# Patient Record
Sex: Female | Born: 1960
Health system: Southern US, Community
[De-identification: ages and names within clinical notes are randomized; demographics above are authoritative.]

## PROBLEM LIST (undated history)

## (undated) DIAGNOSIS — Z87440 Personal history of urinary (tract) infections: Secondary | ICD-10-CM

## (undated) HISTORY — PX: ENDOMETRIAL ABLATION: SHX621

## (undated) HISTORY — PX: APPENDECTOMY: SHX54

## (undated) HISTORY — PX: HAND SURGERY: SHX662

## (undated) HISTORY — PX: BLADDER SURGERY: SHX569

---

## 1998-05-13 ENCOUNTER — Ambulatory Visit (HOSPITAL_COMMUNITY): Admission: RE | Admit: 1998-05-13 | Discharge: 1998-05-13 | Payer: Self-pay | Admitting: Gynecology

## 1998-05-13 ENCOUNTER — Encounter: Payer: Self-pay | Admitting: Gynecology

## 1998-07-13 ENCOUNTER — Inpatient Hospital Stay (HOSPITAL_COMMUNITY): Admission: AD | Admit: 1998-07-13 | Discharge: 1998-07-13 | Payer: Self-pay | Admitting: Gynecology

## 1998-07-13 ENCOUNTER — Ambulatory Visit (HOSPITAL_COMMUNITY): Admission: RE | Admit: 1998-07-13 | Discharge: 1998-07-13 | Payer: Self-pay | Admitting: Gynecology

## 1999-08-27 ENCOUNTER — Other Ambulatory Visit: Admission: RE | Admit: 1999-08-27 | Discharge: 1999-08-27 | Payer: Self-pay | Admitting: Gynecology

## 1999-10-13 ENCOUNTER — Other Ambulatory Visit: Admission: RE | Admit: 1999-10-13 | Discharge: 1999-10-13 | Payer: Self-pay | Admitting: Gynecology

## 1999-10-16 ENCOUNTER — Ambulatory Visit (HOSPITAL_COMMUNITY): Admission: RE | Admit: 1999-10-16 | Discharge: 1999-10-16 | Payer: Self-pay | Admitting: Gynecology

## 2000-02-13 ENCOUNTER — Inpatient Hospital Stay (HOSPITAL_COMMUNITY): Admission: AD | Admit: 2000-02-13 | Discharge: 2000-02-13 | Payer: Self-pay | Admitting: Gynecology

## 2000-03-01 ENCOUNTER — Encounter: Admission: RE | Admit: 2000-03-01 | Discharge: 2000-03-01 | Payer: Self-pay | Admitting: Internal Medicine

## 2000-03-01 ENCOUNTER — Encounter: Payer: Self-pay | Admitting: Internal Medicine

## 2000-03-13 ENCOUNTER — Inpatient Hospital Stay (HOSPITAL_COMMUNITY): Admission: AD | Admit: 2000-03-13 | Discharge: 2000-03-13 | Payer: Self-pay | Admitting: Gynecology

## 2000-10-26 ENCOUNTER — Ambulatory Visit (HOSPITAL_COMMUNITY): Admission: RE | Admit: 2000-10-26 | Discharge: 2000-10-26 | Payer: Self-pay | Admitting: Gynecology

## 2000-10-26 ENCOUNTER — Encounter: Payer: Self-pay | Admitting: Gynecology

## 2001-08-23 ENCOUNTER — Other Ambulatory Visit: Admission: RE | Admit: 2001-08-23 | Discharge: 2001-08-23 | Payer: Self-pay | Admitting: Obstetrics and Gynecology

## 2002-03-03 ENCOUNTER — Inpatient Hospital Stay (HOSPITAL_COMMUNITY): Admission: AD | Admit: 2002-03-03 | Discharge: 2002-03-05 | Payer: Self-pay | Admitting: Obstetrics and Gynecology

## 2002-03-06 ENCOUNTER — Encounter: Admission: RE | Admit: 2002-03-06 | Discharge: 2002-04-05 | Payer: Self-pay | Admitting: Obstetrics and Gynecology

## 2002-04-03 ENCOUNTER — Other Ambulatory Visit: Admission: RE | Admit: 2002-04-03 | Discharge: 2002-04-03 | Payer: Self-pay | Admitting: Obstetrics and Gynecology

## 2002-05-06 ENCOUNTER — Encounter: Admission: RE | Admit: 2002-05-06 | Discharge: 2002-06-05 | Payer: Self-pay | Admitting: Obstetrics and Gynecology

## 2002-07-06 ENCOUNTER — Encounter: Admission: RE | Admit: 2002-07-06 | Discharge: 2002-08-05 | Payer: Self-pay | Admitting: Obstetrics and Gynecology

## 2002-08-06 ENCOUNTER — Encounter: Admission: RE | Admit: 2002-08-06 | Discharge: 2002-09-05 | Payer: Self-pay | Admitting: Obstetrics and Gynecology

## 2002-10-05 ENCOUNTER — Encounter: Admission: RE | Admit: 2002-10-05 | Discharge: 2002-11-04 | Payer: Self-pay | Admitting: Obstetrics and Gynecology

## 2002-12-05 ENCOUNTER — Encounter: Admission: RE | Admit: 2002-12-05 | Discharge: 2003-01-04 | Payer: Self-pay | Admitting: Obstetrics and Gynecology

## 2003-02-04 ENCOUNTER — Encounter: Admission: RE | Admit: 2003-02-04 | Discharge: 2003-03-06 | Payer: Self-pay | Admitting: Obstetrics and Gynecology

## 2003-03-07 ENCOUNTER — Encounter: Admission: RE | Admit: 2003-03-07 | Discharge: 2003-04-06 | Payer: Self-pay | Admitting: Obstetrics and Gynecology

## 2004-08-05 ENCOUNTER — Ambulatory Visit: Payer: Self-pay | Admitting: Pulmonary Disease

## 2005-01-14 ENCOUNTER — Encounter: Admission: RE | Admit: 2005-01-14 | Discharge: 2005-01-14 | Payer: Self-pay | Admitting: Occupational Medicine

## 2005-03-29 ENCOUNTER — Other Ambulatory Visit: Admission: RE | Admit: 2005-03-29 | Discharge: 2005-03-29 | Payer: Self-pay | Admitting: Obstetrics and Gynecology

## 2005-04-20 ENCOUNTER — Encounter: Admission: RE | Admit: 2005-04-20 | Discharge: 2005-04-20 | Payer: Self-pay | Admitting: Obstetrics and Gynecology

## 2005-11-01 ENCOUNTER — Encounter: Admission: RE | Admit: 2005-11-01 | Discharge: 2006-01-30 | Payer: Self-pay | Admitting: Podiatry

## 2006-05-31 ENCOUNTER — Encounter: Admission: RE | Admit: 2006-05-31 | Discharge: 2006-05-31 | Payer: Self-pay | Admitting: Obstetrics and Gynecology

## 2006-09-07 ENCOUNTER — Ambulatory Visit (HOSPITAL_COMMUNITY): Admission: RE | Admit: 2006-09-07 | Discharge: 2006-09-07 | Payer: Self-pay | Admitting: Obstetrics and Gynecology

## 2006-09-20 ENCOUNTER — Inpatient Hospital Stay (HOSPITAL_COMMUNITY): Admission: AD | Admit: 2006-09-20 | Discharge: 2006-09-21 | Payer: Self-pay | Admitting: Obstetrics & Gynecology

## 2007-06-15 ENCOUNTER — Ambulatory Visit (HOSPITAL_COMMUNITY): Admission: RE | Admit: 2007-06-15 | Discharge: 2007-06-15 | Payer: Self-pay | Admitting: Obstetrics and Gynecology

## 2008-06-19 ENCOUNTER — Ambulatory Visit (HOSPITAL_COMMUNITY): Admission: RE | Admit: 2008-06-19 | Discharge: 2008-06-19 | Payer: Self-pay | Admitting: Obstetrics and Gynecology

## 2009-07-03 ENCOUNTER — Encounter: Admission: RE | Admit: 2009-07-03 | Discharge: 2009-07-03 | Payer: Self-pay | Admitting: Obstetrics and Gynecology

## 2010-03-26 ENCOUNTER — Emergency Department (HOSPITAL_COMMUNITY): Admission: EM | Admit: 2010-03-26 | Discharge: 2010-03-26 | Payer: Self-pay | Admitting: Emergency Medicine

## 2010-04-02 ENCOUNTER — Emergency Department (HOSPITAL_COMMUNITY): Admission: EM | Admit: 2010-04-02 | Discharge: 2010-04-02 | Payer: Self-pay | Admitting: Family Medicine

## 2010-07-07 ENCOUNTER — Ambulatory Visit (HOSPITAL_COMMUNITY): Admission: RE | Admit: 2010-07-07 | Payer: Self-pay | Source: Home / Self Care | Admitting: Obstetrics and Gynecology

## 2010-07-10 ENCOUNTER — Encounter
Admission: RE | Admit: 2010-07-10 | Discharge: 2010-07-10 | Payer: Self-pay | Source: Home / Self Care | Attending: Obstetrics and Gynecology | Admitting: Obstetrics and Gynecology

## 2010-07-19 ENCOUNTER — Encounter: Payer: Self-pay | Admitting: Obstetrics and Gynecology

## 2010-07-29 ENCOUNTER — Encounter: Payer: Self-pay | Admitting: Obstetrics and Gynecology

## 2010-09-10 LAB — WOUND CULTURE

## 2010-11-13 NOTE — Op Note (Signed)
[facility]  Patient:    Shelley Pearson, Shelley Pearson                      MRN: [id number] Proc. Date: 10/16/99 Adm. Date:  [id number] Disc. Date: [id number] Attending:  Katrina Stack                           Operative Report  REDICTATION  PREOPERATIVE DIAGNOSES:  Endometriosis with incapacitating cyclic pelvic pain, recurrent, post previous diagnostic laparoscopy, fimbriolysis and fimbrioplasty for a right hydrosalpinx, lysis of extensive pelvic adhesions, laser ablation of stage III endometriosis.  POSTOPERATIVE DIAGNOSES:  Endometriosis with incapacitating cyclic pelvic pain, recurrent, post previous diagnostic laparoscopy, fimbriolysis and fimbrioplasty for a right hydrosalpinx, lysis of extensive pelvic adhesions, laser ablation of stage III endometriosis.  PROCEDURE:  Diagnostic laparoscopy, laser vaporization of extensive stage II endometriosis, lysis of minor adhesions.  SURGEON:  Gretta Cool, M.D.  ANESTHESIA:  General endotracheal.  DESCRIPTION OF PROCEDURE:  Under excellent anesthesia, as above, with the patients abdomen prepped and draped as a sterile field, with Hulka tenaculum applied to her cervix and bladder drained, a subumbilical incision was made and Veress cannula introduced.  After adequate pneumoperitoneum with CO2, laparoscopic trocar was introduced and pelvic organs visualized.  Accessory probes were then placed and for manipulation of the adnexal structures. Examination revealed again extensive endometriosis, now rated at stage III with involvement of the cul-de-sac, the posterior aspect of the uterus, both posterior broad ligaments.  She had no evidence of recurrence of the hydrosalpinx previously noted.  The fallopian tube was of normal size.  The fimbriated end appeared somewhat agglutinated but there was definitely not recurrence of the hydrosalpinx.  A paraovarian cyst was present and was probably the cause of the  ultrasound findings suggesting recurrent hydrosalpinx.  She had no evidence of ovarian endometrioma and only minor surface disease on the ovaries.  At this point, the paraovarian cyst was incised with laser and drained.  The laser was then used to vaporized all of the endometriosis possible.  A brush technique was used to treat the surface disease of the posterior aspect of the broad ligament, so as to sterilize the surface of peritoneum of endometriosis next to large areas of endometriosis. There was no evidence of deep burrowing disease and deeply invasive endometriosis; it all appeared to be quite surface disease.  Careful attempt was made to remove all of the carbon and debris by forceful irrigation of the peritoneal surfaces.  Fallopian tube dye injection was undertaken and ready spill was identified from the left fallopian tube and possible spill from the right was noted.  The dye was then irrigated from the peritoneal cavity and the pelvis filled with a liter of lactated Ringers to float the structures out of the pelvis and prevent re-adhesion.  At this point, the procedure was terminated without complication, gas allowed to escape, incisions closed with deep suture of 5-0 Dexon and the skin closure with Steri-Strips.  At the end of the procedure, sponge and lap counts were correct.  No complications. Patient returned to recovery room in excellent condition. DD:  11/21/99 TD:  11/25/99 Job: [id number]

## 2010-11-13 NOTE — H&P (Signed)
Shelley Pearson, Shelley Pearson NO.:  [id number]   MEDICAL RECORD NO.:  [id number]          PATIENT TYPE:  AMB   LOCATION:  SDC                           FACILITY:  [facility]   PHYSICIAN:  Juluis Mire, M.D.   DATE OF BIRTH:  Aug 23, 1960   DATE OF ADMISSION:  09/07/2006  DATE OF DISCHARGE:                              HISTORY & PHYSICAL   HISTORY OF PRESENT ILLNESS:  The patient is a 50 year old gravida 2,  para 2 female who presents for management of stress incontinence.   The patient has had worsening stress incontinence, leaking urine with  events such as exercise, sneezing or coughing.  She underwent urodynamic  studies in the office.  She did have a borderline bladder capacity.  She  had minimal residual.  The studies were consistent with urethral  hypermobility.  She had normal leak point pressures as well as a normal  urethral pressure profile.  There was no evidence of overactive bladder  activity.  Her voiding pattern revealed no obstructive voiding pattern.  She did have a mild cystourethrocele.  In view of this, she decided to  proceed now with a mid-urethral sling.  We are going to use an obturator  approach.   ALLERGIES:  NO KNOWN DRUG ALLERGIES.   MEDICATIONS:  1. Albuterol inhaler.  2. Citalopram 20 mg a day.  3. Advair b.i.d.   PAST MEDICAL HISTORY:  She does have a history of asthmatic bronchitis,  otherwise usual childhood disease without any significant sequelae.   PAST SURGICAL HISTORY:  1. She had laparoscopies in 1974 and 1977.  2. She had appendectomy at age 61.  3. She had 2 scar revisions.  4. She had previous hand surgery for carpal tunnel.   OBSTETRICAL HISTORY:  She has also had 2 vaginal deliveries.   FAMILY HISTORY:  Mother with a history of chronic hypertension, father  with a history of prostate cancer.   SOCIAL HISTORY:  No tobacco or alcohol use.   REVIEW OF SYSTEMS:  Noncontributory.   PHYSICAL EXAM:  VITAL SIGNS:  The patient  is afebrile with stable vital  signs.  HEENT:  The patient is normocephalic.  Pupils are equal, round and  reactive to light and accommodation.  Extraocular movements were intact.  Sclerae and conjunctivae were clear.  Oropharynx clear.  NECK:  Without thyromegaly.  BREASTS:  No discrete masses.  LUNGS:  Clear.  CARDIOVASCULAR:  Regular rhythm and rate without murmurs or gallops.  ABDOMEN:  Exam is benign.  No masses, organomegaly or tenderness.  PELVIC:  Normal external genitalia.  Vaginal mucosa is clear.  She does  have mild cystourethrocele.  Cervix unremarkable.  Uterus normal size,  shape and contour.  Adnexa free of masses or tenderness.  EXTREMITIES:  Trace edema.  NEUROLOGICAL:  Exam is grossly within normal limits.   IMPRESSION:  Anatomical stress urinary incontinence due to urethral  hypermobility.   PLAN:  The patient will undergo a mid-urethral sling.  The success rates  of 80% to 85% are quoted.  We have discussed the potential risk of over-  tightening  leading to inability to void, requiring long-term  catheterization; this could require [place] to take the sling down.  If we  took the sling down, there could be a return of her incontinence.  There  is a risk of mesh erosion into vagina, the bladder or urethra that could  require removal of mesh and return of incontinence.  There is the risk  of obturator nerve injury during placement; this could lead to chronic  leg pain and weakness.  There is also the risk of injury to organs  including bladder, urethra and ureters that could require further  exploratory surgery.  There is a risk of infection, the risk of vascular  injury that could lead to hemorrhage requiring transfusion with the risk  of AIDS or hepatitis, the risk of deep venous thrombosis and pulmonary  embolus.  The patient expressed understanding of indications, risks and  alternatives.      Juluis Mire, M.D.  Electronically Signed     JSM/MEDQ  D:   09/07/2006  T:  09/08/2006  Job:  [id number]

## 2010-11-13 NOTE — Op Note (Signed)
NAMEKLOI, BRODMAN NO.:  [id number]   MEDICAL RECORD NO.:  [id number]          PATIENT TYPE:  AMB   LOCATION:  SDC                           FACILITY:  [facility]   PHYSICIAN:  Juluis Mire, M.D.   DATE OF BIRTH:  08-Dec-1960   DATE OF PROCEDURE:  09/07/2006  DATE OF DISCHARGE:                               OPERATIVE REPORT   PREOPERATIVE DIAGNOSIS:  Stress urinary incontinence.   POSTOPERATIVE DIAGNOSIS:  Stress urinary incontinence.   OPERATIVE PROCEDURE:  Mid urethral sling using the obturator approach.  Cystoscopy.   SURGEON:  Juluis Mire, M.D.   ANESTHESIA:  General.   ESTIMATED BLOOD LOSS:  Minimal.   PACKS AND DRAINS:  None.   INTRAOPERATIVE BLOOD REPLACED:  None.   COMPLICATIONS:  None.   INDICATIONS:  Dictated in history and physical.   PROCEDURE AS FOLLOWS:  The patient was taken to the OR and placed in  supine position.  After satisfactory level of general endotracheal  anesthesia was obtained, the patient was placed in the dorsal spine  position using the Allen stirrups.  The perineum and vagina were  cleansed out with Betadine and draped in sterile field.  At this point  in time, a Foley was placed to straight drain.  The mid urethral area  was identified.  The area was infiltrated with 1% Xylocaine with  1:100,000 of epinephrine.  An incision was made in the mid urethral  area.  We then dissected out laterally to the obturator foramen on each  side.  Next, on the perineum, we identified 2 areas at the level of the  clitoris below the abductus longus tendon and out the lateral edge of  the inferior pubic ramus.  A cell area was infiltrated with Xylocaine  and epinephrine.  A punch incision was made.  Next, using the [facility]  Scientific halo, the needles were placed through the obturator foramen,  encircled around the inferior pubic ramus and brought out through the  vaginal incision on each side.  Next, cystoscopy was performed.  There  was no evidence of any intrusion of the bladder or the urethra.  Both  ureteral orifices were visualized and noted to have puff of clear urine.  At this point in time, the polypropylene mesh sling was brought into  place and brought out through the incisions and properly fixed under the  urethra.  It was in the mid urethral segment.  It was lying flat, but we  could easily get a Tresa Endo and rotate it 90 degrees.  Felt like it was not  over tightened.  At this point in time, the plastic sheaths were  removed, and the ends of the polypropylene mesh were trimmed flush with  the skin.  They retracted nicely.  Vaginal mucosa was then closed with a  running suture of 2-0 Monocryl.  The skin incisions were closed with  Dermabond.  A  Foley was placed to straight drain.  Clear urine was still observed.  At  this point in time, the patient was taken out of the dorsal lithotomy  position. Once  alert and extubated, transferred to recovery room in good  condition.  Sponge, instrument and needle counts were reported as  correct by circulating nurse x2.      Juluis Mire, M.D.  Electronically Signed     JSM/MEDQ  D:  09/07/2006  T:  09/08/2006  Job:  [id number]

## 2011-06-01 ENCOUNTER — Encounter: Payer: Self-pay | Admitting: Internal Medicine

## 2011-06-27 ENCOUNTER — Emergency Department (INDEPENDENT_AMBULATORY_CARE_PROVIDER_SITE_OTHER): Payer: 59

## 2011-06-27 ENCOUNTER — Inpatient Hospital Stay (HOSPITAL_BASED_OUTPATIENT_CLINIC_OR_DEPARTMENT_OTHER)
Admission: EM | Admit: 2011-06-27 | Discharge: 2011-06-28 | DRG: 200 | Disposition: A | Discharge status: Home / Self Care | Payer: 59 | Attending: General Surgery | Admitting: General Surgery

## 2011-06-27 ENCOUNTER — Emergency Department (HOSPITAL_BASED_OUTPATIENT_CLINIC_OR_DEPARTMENT_OTHER): Payer: 59

## 2011-06-27 DIAGNOSIS — F1010 Alcohol abuse, uncomplicated: Secondary | ICD-10-CM | POA: Diagnosis present

## 2011-06-27 DIAGNOSIS — M79609 Pain in unspecified limb: Secondary | ICD-10-CM

## 2011-06-27 DIAGNOSIS — R079 Chest pain, unspecified: Secondary | ICD-10-CM

## 2011-06-27 DIAGNOSIS — Z87440 Personal history of urinary (tract) infections: Secondary | ICD-10-CM

## 2011-06-27 DIAGNOSIS — S2241XA Multiple fractures of ribs, right side, initial encounter for closed fracture: Secondary | ICD-10-CM | POA: Diagnosis present

## 2011-06-27 DIAGNOSIS — M549 Dorsalgia, unspecified: Secondary | ICD-10-CM

## 2011-06-27 DIAGNOSIS — S270XXA Traumatic pneumothorax, initial encounter: Secondary | ICD-10-CM

## 2011-06-27 DIAGNOSIS — J939 Pneumothorax, unspecified: Secondary | ICD-10-CM | POA: Diagnosis present

## 2011-06-27 DIAGNOSIS — S2239XA Fracture of one rib, unspecified side, initial encounter for closed fracture: Secondary | ICD-10-CM

## 2011-06-27 DIAGNOSIS — S2249XA Multiple fractures of ribs, unspecified side, initial encounter for closed fracture: Secondary | ICD-10-CM

## 2011-06-27 DIAGNOSIS — W19XXXA Unspecified fall, initial encounter: Secondary | ICD-10-CM | POA: Diagnosis present

## 2011-06-27 DIAGNOSIS — J9383 Other pneumothorax: Secondary | ICD-10-CM

## 2011-06-27 DIAGNOSIS — R109 Unspecified abdominal pain: Secondary | ICD-10-CM

## 2011-06-27 DIAGNOSIS — Z888 Allergy status to other drugs, medicaments and biological substances status: Secondary | ICD-10-CM

## 2011-06-27 DIAGNOSIS — M25559 Pain in unspecified hip: Secondary | ICD-10-CM

## 2011-06-27 DIAGNOSIS — Z79899 Other long term (current) drug therapy: Secondary | ICD-10-CM

## 2011-06-27 DIAGNOSIS — J45909 Unspecified asthma, uncomplicated: Secondary | ICD-10-CM | POA: Diagnosis present

## 2011-06-27 DIAGNOSIS — N949 Unspecified condition associated with female genital organs and menstrual cycle: Secondary | ICD-10-CM

## 2011-06-27 HISTORY — DX: Personal history of urinary (tract) infections: Z87.440

## 2011-06-27 LAB — CBC
Hemoglobin: 12.1 g/dL (ref 12.0–15.0)
Platelets: 197 10*3/uL (ref 150–400)
RBC: 4.09 MIL/uL (ref 3.87–5.11)
RDW: 14.2 % (ref 11.5–15.5)
WBC: 7.8 10*3/uL (ref 4.0–10.5)

## 2011-06-27 LAB — DIFFERENTIAL
Basophils Absolute: 0 10*3/uL (ref 0.0–0.1)
Basophils Relative: 0 % (ref 0–1)
Eosinophils Absolute: 0.1 10*3/uL (ref 0.0–0.7)
Lymphocytes Relative: 14 % (ref 12–46)
Neutro Abs: 6.2 10*3/uL (ref 1.7–7.7)
Neutrophils Relative %: 79 % — ABNORMAL HIGH (ref 43–77)

## 2011-06-27 LAB — COMPREHENSIVE METABOLIC PANEL
ALT: 26 U/L (ref 0–35)
AST: 38 U/L — ABNORMAL HIGH (ref 0–37)
CO2: 29 mEq/L (ref 19–32)
Calcium: 9.3 mg/dL (ref 8.4–10.5)
Chloride: 102 mEq/L (ref 96–112)
GFR calc Af Amer: 84 mL/min — ABNORMAL LOW (ref >90)
GFR calc non Af Amer: 73 mL/min — ABNORMAL LOW (ref >90)
Glucose, Bld: 108 mg/dL — ABNORMAL HIGH (ref 70–99)
Total Protein: 6.9 g/dL (ref 6.0–8.3)

## 2011-06-27 MED ORDER — HYDROMORPHONE HCL PF 1 MG/ML IJ SOLN
1.0000 mg | Freq: Once | INTRAMUSCULAR | Status: AC
  Administered 2011-06-27: 1 mg via INTRAVENOUS
  Filled 2011-06-27: qty 1

## 2011-06-27 MED ORDER — HYDROCODONE-ACETAMINOPHEN 5-325 MG PO TABS
2.0000 | ORAL_TABLET | Freq: Once | ORAL | Status: AC
  Administered 2011-06-27: 2 via ORAL
  Filled 2011-06-27: qty 2

## 2011-06-27 MED ORDER — SODIUM CHLORIDE 0.9 % IV SOLN
Freq: Once | INTRAVENOUS | Status: AC
  Administered 2011-06-27: 1000 mL via INTRAVENOUS

## 2011-06-27 MED ORDER — IOHEXOL 300 MG/ML  SOLN
100.0000 mL | Freq: Once | INTRAMUSCULAR | Status: AC | PRN
  Administered 2011-06-27: 100 mL via INTRAVENOUS

## 2011-06-27 MED ORDER — ONDANSETRON HCL 4 MG/2ML IJ SOLN
4.0000 mg | Freq: Once | INTRAMUSCULAR | Status: AC
  Administered 2011-06-27: 4 mg via INTRAVENOUS
  Filled 2011-06-27: qty 2

## 2011-06-27 NOTE — ED Provider Notes (Signed)
History     CSN: [id number]  Arrival date & time 06/27/11  1478   First MD Initiated Contact with Patient 06/27/11 2043      Chief Complaint  Patient presents with  . Fall    (Consider location/radiation/quality/duration/timing/severity/associated sxs/prior treatment) Patient is a 50 y.o. female presenting with fall. The history is provided by the patient. No language interpreter was used.  Fall The accident occurred less than 1 hour ago. The fall occurred while recreating/playing. She fell from a height of 3 to 5 ft. She landed on dirt. There was no blood loss. The point of impact was the right knee. The pain is at a severity of 8/10. The pain is severe. She was ambulatory at the scene. There was no entrapment after the fall. There was no drug use involved in the accident. There was no alcohol use involved in the accident. The symptoms are aggravated by activity. She has tried nothing for the symptoms. The treatment provided moderate relief.  Pt reports she was riding a blind horse and horse spooked in the woods.  Pt complains of pain in right ribs,  Pain in low back.  Pt reports she had on a helmet.  Pt did not hit her head.  No loss of conciousness  Past Medical History  Diagnosis Date  . Asthma     Past Surgical History  Procedure Date  . Hand surgery   . Bladder surgery   . Endometrial ablation   . Appendectomy     No family history on file.  History  Substance Use Topics  . Smoking status: Never Smoker   . Smokeless tobacco: Not on file  . Alcohol Use: Yes    OB History    Grav Para Term Preterm Abortions TAB SAB Ect Mult Living                  Review of Systems  All other systems reviewed and are negative.    Allergies  Suprax  Home Medications   Current Outpatient Rx  Name Route Sig Dispense Refill  . ALBUTEROL SULFATE HFA 108 (90 BASE) MCG/ACT IN AERS Inhalation Inhale 2 puffs into the lungs every 6 (six) hours as needed. For shortness of breath  or wheezing      . ESCITALOPRAM OXALATE 20 MG PO TABS Oral Take 20 mg by mouth daily.      Marland Kitchen NITROFURANTOIN MONOHYD MACRO 100 MG PO CAPS Oral Take 100 mg by mouth daily.      Marland Kitchen PHENTERMINE HCL 37.5 MG PO CAPS Oral Take 37.5 mg by mouth every morning.        BP 113/57  Pulse 96  Temp(Src) 97.7 F (36.5 C) (Oral)  Resp 18  Ht 5\' 5"  (1.651 m)  Wt 131 lb (59.421 kg)  BMI 21.80 kg/m2  SpO2 100%  LMP 04/22/2011  Physical Exam  Nursing note and vitals reviewed. Constitutional: She is oriented to person, place, and time. She appears well-developed and well-nourished.  HENT:  Head: Normocephalic and atraumatic.  Right Ear: External ear normal.  Left Ear: External ear normal.  Nose: Nose normal.  Mouth/Throat: Oropharynx is clear and moist.  Eyes: Conjunctivae and EOM are normal. Pupils are equal, round, and reactive to light.  Neck: Normal range of motion. Neck supple.  Cardiovascular: Normal rate and normal heart sounds.   Pulmonary/Chest: Effort normal and breath sounds normal.  Abdominal: Soft.  Musculoskeletal: She exhibits tenderness.       Tender right anterior  ribs,  Bruised right low back,  Bruised area right lower leg.    Neurological: She is alert and oriented to person, place, and time.  Skin: Skin is warm.  Psychiatric: She has a normal mood and affect.    ED Course  Procedures (including critical care time)  Labs Reviewed - No data to display No results found.   No diagnosis found.    MDM  Pt has 10 percent pneumothorax.  Ct scan of chest shows rib fracture at right 4th and 5th ribs.    Dr. Rosalia Hammers in to see.  I spoke to Dr. Andrey Campanile trauma surgeon.  He advised transfer to [place].  Trauma will see there.        Langston Masker, [place] 06/27/11 2336  Langston Masker, [place] 06/27/11 [phone]

## 2011-06-27 NOTE — ED Notes (Signed)
Thrown from horse approx 5pm-pain to right rib area, right leg, lower hip/flank pain

## 2011-06-28 ENCOUNTER — Encounter (HOSPITAL_COMMUNITY): Payer: Self-pay | Admitting: General Surgery

## 2011-06-28 ENCOUNTER — Inpatient Hospital Stay (HOSPITAL_COMMUNITY): Payer: 59

## 2011-06-28 DIAGNOSIS — Z789 Other specified health status: Secondary | ICD-10-CM | POA: Insufficient documentation

## 2011-06-28 DIAGNOSIS — J939 Pneumothorax, unspecified: Secondary | ICD-10-CM | POA: Diagnosis present

## 2011-06-28 DIAGNOSIS — W19XXXA Unspecified fall, initial encounter: Secondary | ICD-10-CM | POA: Diagnosis present

## 2011-06-28 DIAGNOSIS — Z7289 Other problems related to lifestyle: Secondary | ICD-10-CM | POA: Insufficient documentation

## 2011-06-28 DIAGNOSIS — N390 Urinary tract infection, site not specified: Secondary | ICD-10-CM | POA: Insufficient documentation

## 2011-06-28 DIAGNOSIS — J45909 Unspecified asthma, uncomplicated: Secondary | ICD-10-CM | POA: Insufficient documentation

## 2011-06-28 DIAGNOSIS — S2241XA Multiple fractures of ribs, right side, initial encounter for closed fracture: Secondary | ICD-10-CM | POA: Diagnosis present

## 2011-06-28 LAB — CBC
HCT: 37.6 % (ref 36.0–46.0)
MCH: 29.5 pg (ref 26.0–34.0)
MCV: 92.4 fL (ref 78.0–100.0)
Platelets: 179 10*3/uL (ref 150–400)
RBC: 4.07 MIL/uL (ref 3.87–5.11)
RDW: 14.6 % (ref 11.5–15.5)

## 2011-06-28 LAB — BASIC METABOLIC PANEL
BUN: 12 mg/dL (ref 6–23)
CO2: 29 mEq/L (ref 19–32)
Calcium: 8.9 mg/dL (ref 8.4–10.5)
Creatinine, Ser: 0.66 mg/dL (ref 0.50–1.10)

## 2011-06-28 MED ORDER — HYDROMORPHONE HCL PF 1 MG/ML IJ SOLN
1.0000 mg | Freq: Once | INTRAMUSCULAR | Status: AC
  Administered 2011-06-28: 1 mg via INTRAVENOUS
  Filled 2011-06-28: qty 1

## 2011-06-28 MED ORDER — SODIUM CHLORIDE 0.9 % IV SOLN
INTRAVENOUS | Status: DC
  Administered 2011-06-28: 05:00:00 via INTRAVENOUS

## 2011-06-28 MED ORDER — MORPHINE SULFATE 2 MG/ML IJ SOLN
INTRAMUSCULAR | Status: AC
  Filled 2011-06-28: qty 1

## 2011-06-28 MED ORDER — OXYCODONE HCL 5 MG PO TABS: 5.0000 mg | ORAL_TABLET | ORAL | Status: DC | PRN

## 2011-06-28 MED ORDER — ESCITALOPRAM OXALATE 20 MG PO TABS
20.0000 mg | ORAL_TABLET | Freq: Every day | ORAL | Status: DC
  Administered 2011-06-28: 20 mg via ORAL
  Filled 2011-06-28: qty 1

## 2011-06-28 MED ORDER — CYCLOBENZAPRINE HCL 10 MG PO TABS
10.0000 mg | ORAL_TABLET | Freq: Three times a day (TID) | ORAL | Status: DC
  Administered 2011-06-28: 10 mg via ORAL
  Filled 2011-06-28 (×2): qty 1

## 2011-06-28 MED ORDER — ONDANSETRON HCL 4 MG/2ML IJ SOLN: 4.0000 mg | Freq: Four times a day (QID) | INTRAMUSCULAR | Status: DC | PRN

## 2011-06-28 MED ORDER — ENOXAPARIN SODIUM 40 MG/0.4ML ~~LOC~~ SOLN
40.0000 mg | SUBCUTANEOUS | Status: DC
  Administered 2011-06-28: 40 mg via SUBCUTANEOUS
  Filled 2011-06-28 (×2): qty 0.4

## 2011-06-28 MED ORDER — MORPHINE SULFATE 2 MG/ML IJ SOLN: 2.0000 mg | INTRAMUSCULAR | Status: DC | PRN

## 2011-06-28 MED ORDER — OXYCODONE HCL 5 MG PO TABS
15.0000 mg | ORAL_TABLET | ORAL | Status: DC | PRN
  Administered 2011-06-28 (×2): 15 mg via ORAL
  Filled 2011-06-28 (×2): qty 3

## 2011-06-28 MED ORDER — OXYCODONE HCL 5 MG PO TABS: 10.0000 mg | ORAL_TABLET | ORAL | Status: DC | PRN

## 2011-06-28 MED ORDER — MORPHINE SULFATE 2 MG/ML IJ SOLN
2.0000 mg | INTRAMUSCULAR | Status: DC | PRN
  Administered 2011-06-28: 2 mg via INTRAVENOUS

## 2011-06-28 MED ORDER — OXYCODONE-ACETAMINOPHEN 7.5-325 MG PO TABS: 1.0000 | ORAL_TABLET | ORAL | Status: DC | PRN

## 2011-06-28 MED ORDER — OXYCODONE HCL 5 MG PO TABS
10.0000 mg | ORAL_TABLET | ORAL | Status: DC | PRN
  Administered 2011-06-28: 10 mg via ORAL
  Filled 2011-06-28: qty 2

## 2011-06-28 MED ORDER — OXYCODONE HCL 5 MG PO TABS: 2.5000 mg | ORAL_TABLET | ORAL | Status: DC | PRN

## 2011-06-28 MED ORDER — ONDANSETRON HCL 4 MG PO TABS: 4.0000 mg | ORAL_TABLET | Freq: Four times a day (QID) | ORAL | Status: DC | PRN

## 2011-06-28 MED ORDER — NITROFURANTOIN MONOHYD MACRO 100 MG PO CAPS
100.0000 mg | ORAL_CAPSULE | Freq: Every day | ORAL | Status: DC
  Administered 2011-06-28: 100 mg via ORAL
  Filled 2011-06-28: qty 1

## 2011-06-28 MED ORDER — CYCLOBENZAPRINE HCL 10 MG PO TABS: 10.0000 mg | ORAL_TABLET | Freq: Three times a day (TID) | ORAL | Status: AC | PRN

## 2011-06-28 MED ORDER — BISACODYL 10 MG RE SUPP: 10.0000 mg | Freq: Every day | RECTAL | Status: DC | PRN

## 2011-06-28 MED ORDER — DOCUSATE SODIUM 100 MG PO CAPS
100.0000 mg | ORAL_CAPSULE | Freq: Two times a day (BID) | ORAL | Status: DC
  Administered 2011-06-28: 100 mg via ORAL
  Filled 2011-06-28: qty 1

## 2011-06-28 MED ORDER — ALBUTEROL SULFATE HFA 108 (90 BASE) MCG/ACT IN AERS
2.0000 | INHALATION_SPRAY | Freq: Four times a day (QID) | RESPIRATORY_TRACT | Status: DC | PRN
  Filled 2011-06-28: qty 6.7

## 2011-06-28 NOTE — Progress Notes (Signed)
This patient has been seen and I agree with the findings and treatment plan.  Wane Mollett O. Juergen Hardenbrook, III, MD, FACS [phone] (pager) [phone] (direct pager) Trauma Surgeon  

## 2011-06-28 NOTE — ED Provider Notes (Signed)
History/physical exam/procedure(s) were performed by non-physician practitioner and as supervising physician I was immediately available for consultation/collaboration. I have reviewed all notes and am in agreement with care and plan.   Karolynn Infantino S Luetta Piazza, MD 06/28/11 1610 

## 2011-06-28 NOTE — Progress Notes (Signed)
Patient discharged to home in care of husband. Medications and instructions reviewed with patient and husband with no questions. IV d/c'd with cath intact. Assessment unchanged from this am. Oxygen sats 95 on rooms air. Patient to follow up with PCP in 2 weeks.

## 2011-06-28 NOTE — ED Provider Notes (Signed)
Pt here from [facility] for 10% PTX to see trauma dr Andrey Campanile She is awake/alert, stable at this time Pain meds ordered Awaiting trauma eval  Joya Gaskins, MD 06/28/11 [phone]

## 2011-06-28 NOTE — Discharge Summary (Signed)
Physician Discharge Summary  Patient ID: Shelley Pearson MRN: [id number] DOB/AGE: 51-Jul-1962 57 y.o.  Admit date: 06/27/2011 Discharge date: 06/28/2011  Discharge Diagnoses Patient Active Problem List  Diagnoses Date Noted  . Pneumothorax, right 06/28/2011  . Multiple fractures of ribs of right side 06/28/2011  . Fall with injury 06/28/2011  . Asthma 06/28/2011  . Recurrent UTI 06/28/2011  . Alcohol use 06/28/2011    Consultants None  Procedures None  HPI: 50yo wf s/p fall from horse. Pt was riding a "blind" horse in a group. One of the horses got spooked which caused a chain reaction among the horses. Pt fell from her horse several feet landing on her right side. She felt as if the wind got knocked out of her. C/o rt sided chest discomfort and RLE discomfort. No LOC. No helmet. Was ambulatory at scene. Went to [facility] where workup was done which revealed 2 right rib fracture and small PTX. Transferred to [facility] for management.    Hospital Course: Patient did well overnight in the hospital. She denied any shortness of breath and had her pain controlled on oral medication. A repeat chest x-ray showed a decrease in size of her pneumothorax from 10 to 5%. She has an involved husband who can take care of her at home and so was able to be discharged there in improved condition.    Current Discharge Medication List    START taking these medications   Details  cyclobenzaprine (FLEXERIL) 10 MG tablet Take 1 tablet (10 mg total) by mouth 3 (three) times daily as needed for muscle spasms. Qty: 30 tablet, Refills: 1    oxyCODONE-acetaminophen (PERCOCET) 7.5-325 MG per tablet Take 1 tablet by mouth every 4 (four) hours as needed for pain. Qty: 60 tablet, Refills: 0      CONTINUE these medications which have NOT CHANGED   Details  albuterol (PROVENTIL HFA;VENTOLIN HFA) 108 (90 BASE) MCG/ACT inhaler Inhale 2 puffs into the lungs every 6 (six) hours as needed. For shortness of breath  or wheezing      escitalopram (LEXAPRO) 20 MG tablet Take 20 mg by mouth daily.      nitrofurantoin, macrocrystal-monohydrate, (MACROBID) 100 MG capsule Take 100 mg by mouth daily.      phentermine 37.5 MG capsule Take 37.5 mg by mouth every morning.           Follow-up Information    Follow up with [facility]. Make an appointment in 2 weeks.      Call [facility]. (As needed)    Contact information:   [address] [place] [address] [phone]         Signed: Freeman Caldron, PA-C Pager: [phone] General Trauma PA Pager: [phone]  06/28/2011, 4:33 PM

## 2011-06-28 NOTE — ED Notes (Signed)
Pt states she was thrown from a horse tonight while riding. Pt states she hit her head and shoulder. Pt with swelling right side of forehead and diminised right lower lobe lung. LOC unknown. Pt was wearing a helmet and marks were noted on back side of helmet. Pt remains AOx4, NAD, Stable resp e/u.

## 2011-06-28 NOTE — Discharge Summary (Signed)
This patient has been seen and I agree with the findings and treatment plan.  Krystine Pabst O. Roland Prine, III, MD, FACS [phone] (pager) [phone] (direct pager) Trauma Surgeon  

## 2011-06-28 NOTE — H&P (Signed)
Shelley Pearson is an 50 y.o. female.   Chief Complaint: fall from house HPI: 50yo wf s/p fall from horse.  Pt was riding a "blind" horse in a group. One of the horses got spooked which caused a chain reaction among the horses.  Pt fell from her horse several feet landing on her right side.  She felt as if the wind got knocked out of her. C/o rt sided chest discomfort and RLE discomfort. No LOC. No helmet. Was ambulatory at scene. Went to [facility] where workup was done which revealed 2 right rib fracture and small PTX.  Transferred to [facility] for management.   Past Medical History  Diagnosis Date  . Asthma   . History of recurrent UTIs     Past Surgical History  Procedure Date  . Hand surgery   . Bladder surgery   . Endometrial ablation   . Appendectomy     No family history on file. Social History:  reports that she has never smoked. She does not have any smokeless tobacco history on file. She reports that she drinks alcohol. Her drug history not on file.  Allergies:  Allergies  Allergen Reactions  . Suprax (Cefixime) Hives    Medications Prior to Admission  Medication Dose Route Frequency Provider Last Rate Last Dose  . 0.9 %  sodium chloride infusion   Intravenous Once Langston Masker, [place] 1,000 mL/hr at 06/27/11 2231 1,000 mL at 06/27/11 2231  . HYDROcodone-acetaminophen (NORCO) 5-325 MG per tablet 2 tablet  2 tablet Oral Once [place], [place]   2 tablet at 06/27/11 2118  . HYDROmorphone (DILAUDID) injection 1 mg  1 mg Intravenous Once [place], [place]   1 mg at 06/27/11 2358  . HYDROmorphone (DILAUDID) injection 1 mg  1 mg Intravenous Once Joya Gaskins, MD   1 mg at 06/28/11 0109  . iohexol (OMNIPAQUE) 300 MG/ML solution 100 mL  100 mL Intravenous Once PRN Medication Radiologist   100 mL at 06/27/11 2250  . ondansetron (ZOFRAN) injection 4 mg  4 mg Intravenous Once [place], [place]   4 mg at 06/27/11 2358   No current outpatient prescriptions on file as of 06/27/2011.     Results for orders placed during the hospital encounter of 06/27/11 (from the past 48 hour(s))  CBC     Status: Normal   Collection Time   06/27/11 10:19 PM      Component Value Range Comment   WBC 7.8  4.0 - 10.5 (K/uL)    RBC 4.09  3.87 - 5.11 (MIL/uL)    Hemoglobin 12.1  12.0 - 15.0 (g/dL)    HCT 86.5  78.4 - 69.6 (%)    MCV 90.5  78.0 - 100.0 (fL)    MCH 29.6  26.0 - 34.0 (pg)    MCHC 32.7  30.0 - 36.0 (g/dL)    RDW 29.5  28.4 - 13.2 (%)    Platelets 197  150 - 400 (K/uL)   DIFFERENTIAL     Status: Abnormal   Collection Time   06/27/11 10:19 PM      Component Value Range Comment   Neutrophils Relative 79 (*) 43 - 77 (%)    Neutro Abs 6.2  1.7 - 7.7 (K/uL)    Lymphocytes Relative 14  12 - 46 (%)    Lymphs Abs 1.1  0.7 - 4.0 (K/uL)    Monocytes Relative 7  3 - 12 (%)    Monocytes Absolute 0.5  0.1 -  1.0 (K/uL)    Eosinophils Relative 1  0 - 5 (%)    Eosinophils Absolute 0.1  0.0 - 0.7 (K/uL)    Basophils Relative 0  0 - 1 (%)    Basophils Absolute 0.0  0.0 - 0.1 (K/uL)   COMPREHENSIVE METABOLIC PANEL     Status: Abnormal   Collection Time   06/27/11 10:19 PM      Component Value Range Comment   Sodium 140  135 - 145 (mEq/L)    Potassium 3.8  3.5 - 5.1 (mEq/L)    Chloride 102  96 - 112 (mEq/L)    CO2 29  19 - 32 (mEq/L)    Glucose, Bld 108 (*) 70 - 99 (mg/dL)    BUN 22  6 - 23 (mg/dL)    Creatinine, Ser 4.09  0.50 - 1.10 (mg/dL)    Calcium 9.3  8.4 - 10.5 (mg/dL)    Total Protein 6.9  6.0 - 8.3 (g/dL)    Albumin 4.1  3.5 - 5.2 (g/dL)    AST 38 (*) 0 - 37 (U/L)    ALT 26  0 - 35 (U/L)    Alkaline Phosphatase 55  39 - 117 (U/L)    Total Bilirubin 0.4  0.3 - 1.2 (mg/dL)    GFR calc non Af Amer 73 (*) >90 (mL/min)    GFR calc Af Amer 84 (*) >90 (mL/min)     RADIOLOGICAL STUDIES: I have personally reviewed the radiological exams myself  Dg Ribs Unilateral W/chest Right  06/27/2011  *RADIOLOGY REPORT*  Clinical Data: Thrown from horse approximately 05:00 p.m.   Pain in the right ribs, right leg, lower hip, and flank.  RIGHT RIBS AND CHEST - 3+ VIEW  Comparison: None.  Findings: Normal heart size and pulmonary vascularity.  No specific right rib fracture is demonstrated, but there is a small right apical pneumothorax.  A occult rib fracture is not excluded.  No evidence of tension.  No focal airspace consolidation.  No blunting of costophrenic angles.  IMPRESSION: Small right apical pneumothorax without tension.  No displaced rib fractures identified.  Results were discussed by telephone with Dr. Rosalia Hammers at the time of dictation, 2154 hours on 06/27/2011  Original Report Authenticated By: Marlon Pel, M.D.   Dg Lumbar Spine Complete  06/27/2011  *RADIOLOGY REPORT*  Clinical Data: Back pain after fall from horse.  LUMBAR SPINE - COMPLETE 4+ VIEW  Comparison: None.  Findings: Curvature of the lumbar spine is likely due to patient positioning.  Atrophic ribs at T12.  Partial sacralization of L5 on the left.  Otherwise normal alignment of the lumbar vertebra and facet joints.  No vertebral compression deformities. Intervertebral disc space heights are preserved.  No focal bone lesion or bone destruction identified.  IMPRESSION: No displaced fractures demonstrated.  Original Report Authenticated By: Marlon Pel, M.D.   Dg Pelvis 1-2 Views  06/27/2011  *RADIOLOGY REPORT*  Clinical Data: Pain after fall from horse.  PELVIS - 1-2 VIEW  Comparison: None.  Findings: The pelvis, hips, SI joints, and symphysis pubis appear intact.  No evidence of acute fracture or subluxation.  No focal bone lesion or bone destruction.  IMPRESSION: No acute bony abnormalities identified.  Original Report Authenticated By: Marlon Pel, M.D.   Dg Tibia/fibula Right  06/27/2011  *RADIOLOGY REPORT*  Clinical Data: Right leg pain after fall from horse.  RIGHT TIBIA AND FIBULA - 2 VIEW  Comparison: None.  Findings: The right tibia and fibula  appear intact.  No evidence of acute  fracture or subluxation.  No focal bone lesion or bone destruction.  Bone cortex and trabecular architecture appear intact.  No abnormal radiopaque foreign bodies in the soft tissues.  IMPRESSION: No acute bony abnormalities identified.  Original Report Authenticated By: Marlon Pel, M.D.   Ct Chest W Contrast  06/27/2011  *RADIOLOGY REPORT*  Clinical Data:  Thrown from horse approximately 5:00 p.m.  Pain in the right rib area, right leg, lower hip, flank.  Pneumothorax seen on chest x-ray.  CT CHEST, ABDOMEN AND PELVIS WITH CONTRAST  Technique:  Multidetector CT imaging of the chest, abdomen and pelvis was performed following the standard protocol during bolus administration of intravenous contrast.  Contrast:  100 ml Omnipaque-300  Comparison:  Chest x-ray earlier today  CT CHEST  Findings:  The heart and great vessels have a normal appearance. No evidence for mediastinal hematoma.  There is a right-sided pneumothorax, approximately 10% of lung volume.  There are minimally displaced fractures of the right anterior fourth and fifth ribs.  No evidence for vertebral or sternal fracture. Minimal right posterior atelectasis or contusion.  No evidence for pleural effusion.  The left lung is clear.  No evidence for left pneumothorax or acute rib fracture. The visualized portion of the thyroid gland has a normal appearance.  IMPRESSION:  1.  Right pneumothorax, approximately 10% of lung volume. 2.  Or acute, minimally displaced fractures of the right anterior fourth and fifth ribs.  CT ABDOMEN AND PELVIS  Findings:  There is focal fat in the region of the falciform ligament of the liver.  No other focal abnormality identified within the liver, spleen, pancreas, adrenal glands, or kidneys. The stomach and small bowel loops have a normal appearance. Colonic loops are normal in wall thickness and caliber.  The appendix is well seen and has a normal appearance.  The uterus and adnexal regions have a normal CT  appearance.  No free pelvic fluid or adenopathy. No evidence for aortic aneurysm. Visualized osseous structures have a normal appearance.  IMPRESSION:  No evidence for acute abnormality in the abdomen pelvis.  Original Report Authenticated By: Patterson Hammersmith, M.D.   Ct Cervical Spine Wo Contrast  06/27/2011  *RADIOLOGY REPORT*  Clinical Data: Thrown from horse approximately 5:00 p.m.  Pain.  CT CERVICAL SPINE WITHOUT CONTRAST  Technique:  Multidetector CT imaging of the cervical spine was performed. Multiplanar CT image reconstructions were also generated.  Comparison: None.  Findings: There is mild degenerative change, most notable at C5-6. There is right foraminal narrowing at this level.  There is no evidence for acute fracture or subluxation.  Alignment is within normal limits.  Images of the lung apices are unremarkable.  IMPRESSION:  1.  Degenerative change, most notable at C5-6. 2. No evidence for acute  abnormality.  Original Report Authenticated By: Patterson Hammersmith, M.D.   Ct Abdomen Pelvis W Contrast  06/27/2011  *RADIOLOGY REPORT*  Clinical Data:  Thrown from horse approximately 5:00 p.m.  Pain in the right rib area, right leg, lower hip, flank.  Pneumothorax seen on chest x-ray.  CT CHEST, ABDOMEN AND PELVIS WITH CONTRAST  Technique:  Multidetector CT imaging of the chest, abdomen and pelvis was performed following the standard protocol during bolus administration of intravenous contrast.  Contrast:  100 ml Omnipaque-300  Comparison:  Chest x-ray earlier today  CT CHEST  Findings:  The heart and great vessels have a normal appearance. No evidence for mediastinal  hematoma.  There is a right-sided pneumothorax, approximately 10% of lung volume.  There are minimally displaced fractures of the right anterior fourth and fifth ribs.  No evidence for vertebral or sternal fracture. Minimal right posterior atelectasis or contusion.  No evidence for pleural effusion.  The left lung is clear.  No  evidence for left pneumothorax or acute rib fracture. The visualized portion of the thyroid gland has a normal appearance.  IMPRESSION:  1.  Right pneumothorax, approximately 10% of lung volume. 2.  Or acute, minimally displaced fractures of the right anterior fourth and fifth ribs.  CT ABDOMEN AND PELVIS  Findings:  There is focal fat in the region of the falciform ligament of the liver.  No other focal abnormality identified within the liver, spleen, pancreas, adrenal glands, or kidneys. The stomach and small bowel loops have a normal appearance. Colonic loops are normal in wall thickness and caliber.  The appendix is well seen and has a normal appearance.  The uterus and adnexal regions have a normal CT appearance.  No free pelvic fluid or adenopathy. No evidence for aortic aneurysm. Visualized osseous structures have a normal appearance.  IMPRESSION:  No evidence for acute abnormality in the abdomen pelvis.  Original Report Authenticated By: Patterson Hammersmith, M.D.    Review of Systems  Constitutional: Negative for fever, chills and weight loss.  HENT: Negative for hearing loss and neck pain.   Eyes: Negative for blurred vision, double vision and pain.  Respiratory: Negative for sputum production and shortness of breath.   Cardiovascular: Positive for chest pain (c/o rt chest tenderness). Negative for palpitations, orthopnea and PND.  Gastrointestinal: Negative for nausea, vomiting and abdominal pain.  Genitourinary:       Finishing first week out of 2week treatment course of macrobid for uti  Musculoskeletal: Negative for joint pain.       C/o RLE pain  Skin: Negative for rash.  Neurological: Negative for dizziness, sensory change, focal weakness, seizures, loss of consciousness and headaches.  Psychiatric/Behavioral: Negative for hallucinations and substance abuse.    Blood pressure 111/63, pulse 89, temperature 98 F (36.7 C), temperature source Oral, resp. rate 20, height 5\' 5"  (1.651  m), weight 131 lb (59.421 kg), last menstrual period 04/22/2011, SpO2 96.00%. Physical Exam  Vitals reviewed. Constitutional: She is oriented to person, place, and time. She appears well-developed and well-nourished. No distress.  HENT:  Head: Normocephalic. Head is with abrasion.    Right Ear: Hearing, tympanic membrane, external ear and ear canal normal.  Left Ear: Hearing, tympanic membrane, external ear and ear canal normal.  Nose: Nose normal.  Mouth/Throat: Uvula is midline and oropharynx is clear and moist. No oropharyngeal exudate.  Eyes: Conjunctivae and EOM are normal. Pupils are equal, round, and reactive to light. Right eye exhibits no discharge. Left eye exhibits no discharge.  Neck: Normal range of motion. Neck supple. No JVD present. No tracheal deviation present.  Cardiovascular: Normal rate, regular rhythm, normal heart sounds and intact distal pulses.   Respiratory: Effort normal and breath sounds normal. No respiratory distress. She has no wheezes. She exhibits tenderness (right sided).  GI: Soft. Bowel sounds are normal. She exhibits no distension. There is no tenderness. There is no rebound and no guarding.  Musculoskeletal: Normal range of motion. She exhibits no edema and no tenderness.  Neurological: She is alert and oriented to person, place, and time. She is not disoriented. No cranial nerve deficit or sensory deficit. She exhibits normal muscle tone.  GCS eye subscore is 4. GCS verbal subscore is 5. GCS motor subscore is 6.  Skin: Skin is warm and dry. Abrasion (anteriomedial aspect RLE) noted. She is not diaphoretic.     Psychiatric: She has a normal mood and affect. Her behavior is normal. Judgment and thought content normal.     Assessment/Plan Patient Active Problem List  Diagnoses  . Pneumothorax, right  . Multiple fractures of ribs of right side  . Fall with injury   Admit for pain control and monitoring of PTX. Pt is currently in NAD, O2 sat 98% on RR  with RR 18 O2 therapy  Continuous pulse ox Repeat CXR in am pulm toilet Ambulate in AM Lovenox.  Explained to pt and husband treatment plan.  Hopefully, ptx will stay stable &/or resolve. Did inform them of possibility of PTX enlarging or pt becoming symptomatic requiring chest tube decompression.  Mary Sella. Andrey Campanile, MD, FACS General, Bariatric, & Minimally Invasive Surgery [facility], [place]   [facility] M 06/28/2011, 2:02 AM

## 2011-06-28 NOTE — Progress Notes (Signed)
Patient ID: Shelley Pearson, female   DOB: May 16, 1961, 50 y.o.   MRN: [id number]   LOS: 1 day   Subjective: No unexpected c/o. Pain controlled with oxy. No SOB.  Objective: Vital signs in last 24 hours: Temp:  [97.4 F (36.3 C)-98.1 F (36.7 C)] 97.9 F (36.6 C) (12/31 0703) Pulse Rate:  [65-96] 65  (12/31 0703) Resp:  [18-20] 18  (12/31 0703) BP: (95-118)/(35-66) 112/59 mmHg (12/31 0703) SpO2:  [96 %-100 %] 100 % (12/31 0703) Weight:  [59.421 kg (131 lb)] 131 lb (59.421 kg) (12/31 0430) Last BM Date: 06/26/11  IS:  CXR: Pending  General appearance: alert and no distress Resp: clear to auscultation bilaterally Cardio: regular rate and rhythm GI: normal findings: bowel sounds normal and soft, non-tender  Assessment/Plan: Fall from horse Right rib fxs x2 w/PTX -- Pain control and pulmonary toilet. Check CXR today. Recurrent UTI's -- Home meds Asthma EtOH use FEN -- No issues VTE -- Lovenox Dispo -- Home once pain controlled, PTX stable. Possibly this afternoon.    Freeman Caldron, PA-C Pager: [phone] General Trauma PA Pager: [phone]   06/28/2011

## 2011-07-02 ENCOUNTER — Other Ambulatory Visit: Payer: Self-pay | Admitting: Internal Medicine

## 2011-07-05 ENCOUNTER — Telehealth: Payer: Self-pay | Admitting: Orthopedic Surgery

## 2011-07-05 MED ORDER — OXYCODONE-ACETAMINOPHEN 5-325 MG PO TABS: 1.0000 | ORAL_TABLET | ORAL | Status: AC | PRN

## 2011-07-05 MED ORDER — NAPROXEN 500 MG PO TABS: 500.0000 mg | ORAL_TABLET | Freq: Two times a day (BID) | ORAL | Status: AC

## 2011-07-05 NOTE — Telephone Encounter (Signed)
Called needing refill on pain medication. Discussed with Dr. Lindie Spruce. Will give prescription for Perc 5mg  and Naproxen 500mg .

## 2011-08-06 ENCOUNTER — Other Ambulatory Visit (HOSPITAL_BASED_OUTPATIENT_CLINIC_OR_DEPARTMENT_OTHER): Payer: Self-pay | Admitting: Obstetrics and Gynecology

## 2011-08-06 ENCOUNTER — Other Ambulatory Visit (HOSPITAL_COMMUNITY): Payer: Self-pay | Admitting: Obstetrics and Gynecology

## 2011-08-06 DIAGNOSIS — Z1231 Encounter for screening mammogram for malignant neoplasm of breast: Secondary | ICD-10-CM

## 2011-09-01 ENCOUNTER — Ambulatory Visit (HOSPITAL_COMMUNITY)
Admission: RE | Admit: 2011-09-01 | Discharge: 2011-09-01 | Disposition: A | Discharge status: Home / Self Care | Payer: 59 | Source: Ambulatory Visit | Attending: Obstetrics and Gynecology | Admitting: Obstetrics and Gynecology

## 2011-09-01 DIAGNOSIS — Z1231 Encounter for screening mammogram for malignant neoplasm of breast: Secondary | ICD-10-CM | POA: Insufficient documentation

## 2012-01-15 IMAGING — CT CT CERVICAL SPINE W/O CM
4 of 9 series · 11 of 33 positions shown, 12 images · IV contrast (APPLIED)
Comparison: None.

CLINICAL DATA: Thrown from horse approximately [DATE] p.m.  Pain.

CT CERVICAL SPINE WITHOUT CONTRAST
TECHNIQUE: Multidetector CT imaging of the cervical spine was
performed. Multiplanar CT image reconstructions were also
generated.

[Series 3: c_spine 2.0 b41s st · axial · 0.25mm/px · z∈[-147,-87]mm · 2 of 90 slices shown]
[im 30/90  bone]
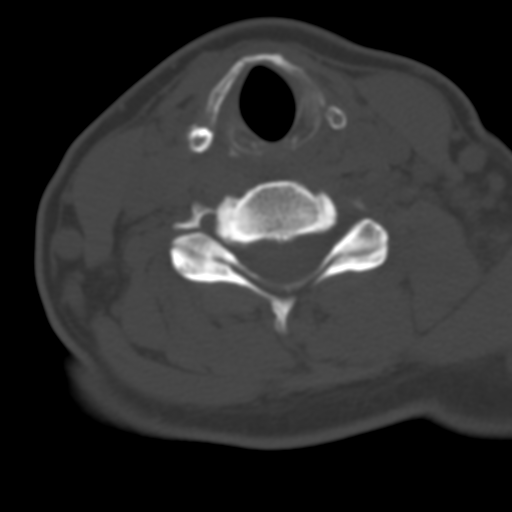
[im 60/90  bone]
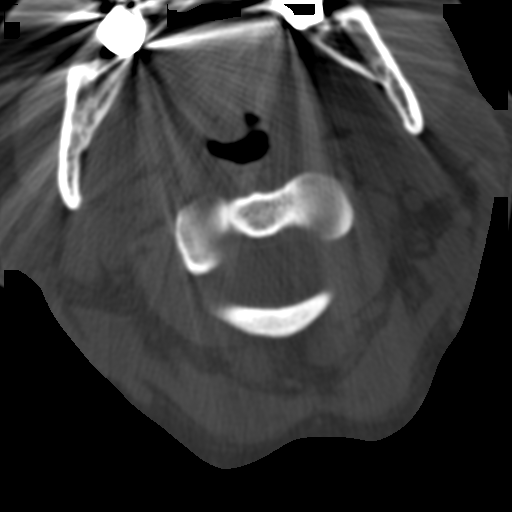

[Series 11: chest/abd/pel 5.0 b31f · axial · 0.81mm/px · z∈[-582,-376]mm · 2 of 125 slices shown, 3 images]
[im 42/125  soft-tissue]
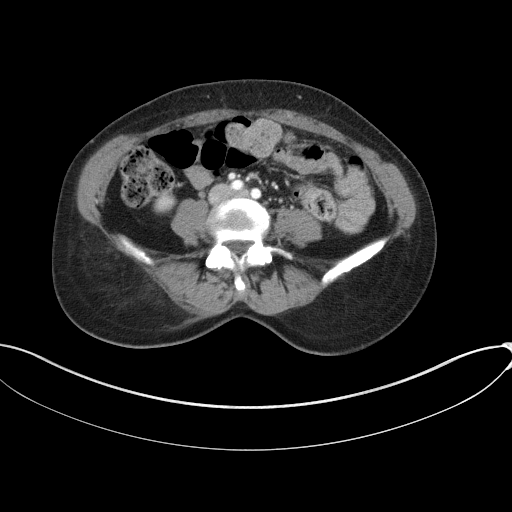
[im 42/125  bone]
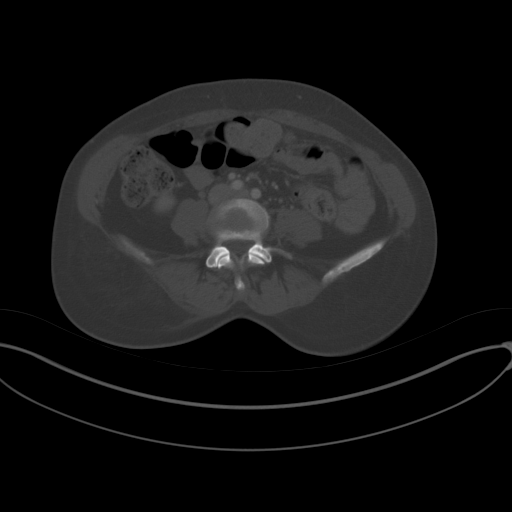
[im 83/125  bone]
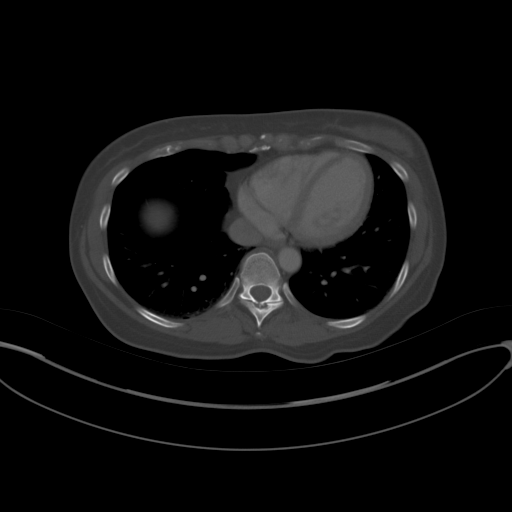

[Series 14: chest/abd/pel 3.0 coronal · coronal · 0.95mm/px · 2 of 71 slices shown]
[im 24/71  bone]
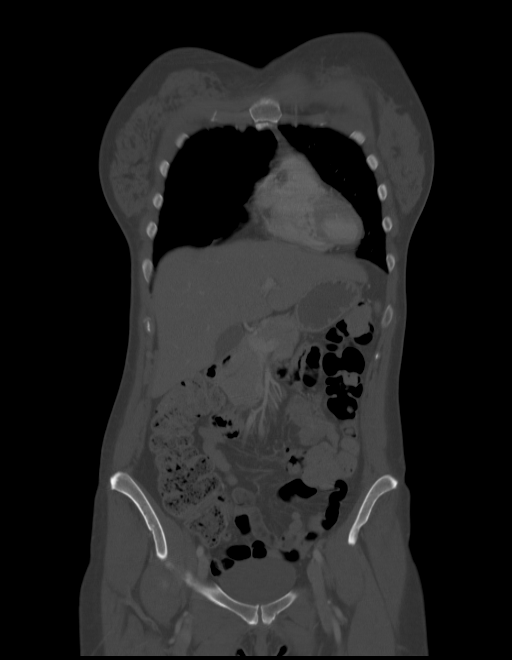
[im 47/71  bone]
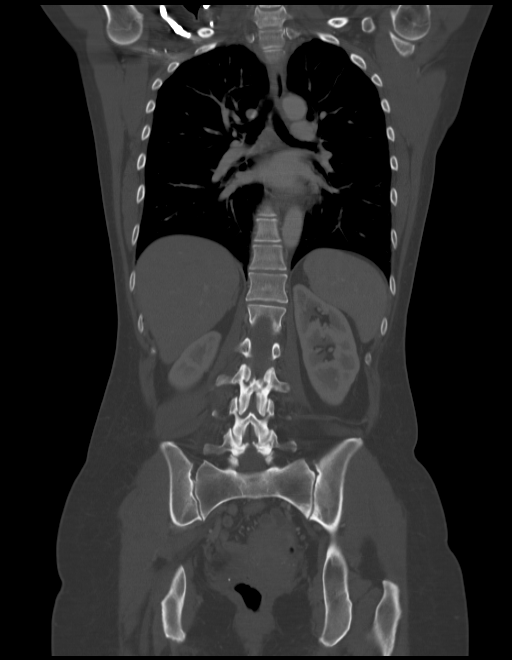

[Series 15: chest/abd/pel 3.0 sagittal · sagittal · 0.59mm/px · 5 of 113 slices shown]
[im 29/113  bone]
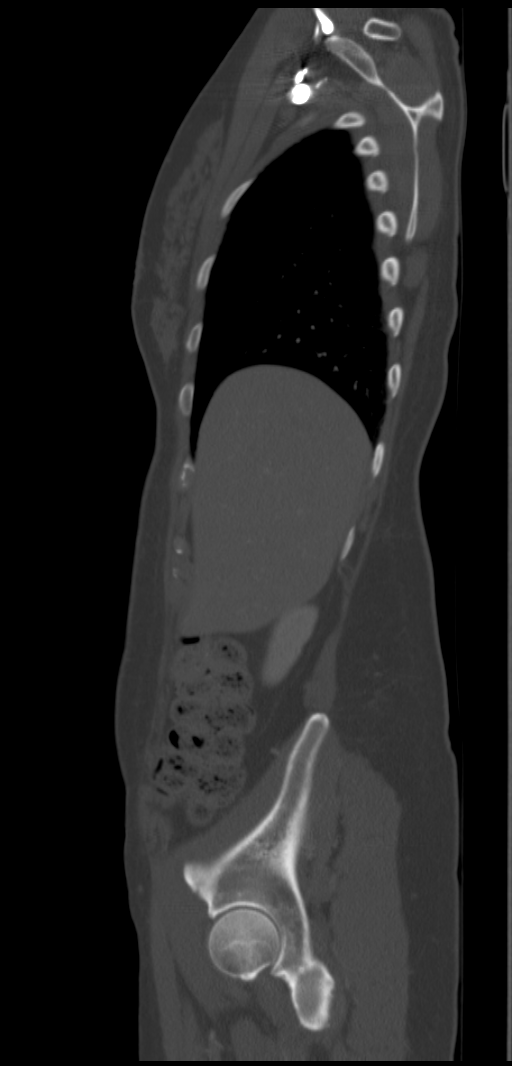
[im 43/113  bone]
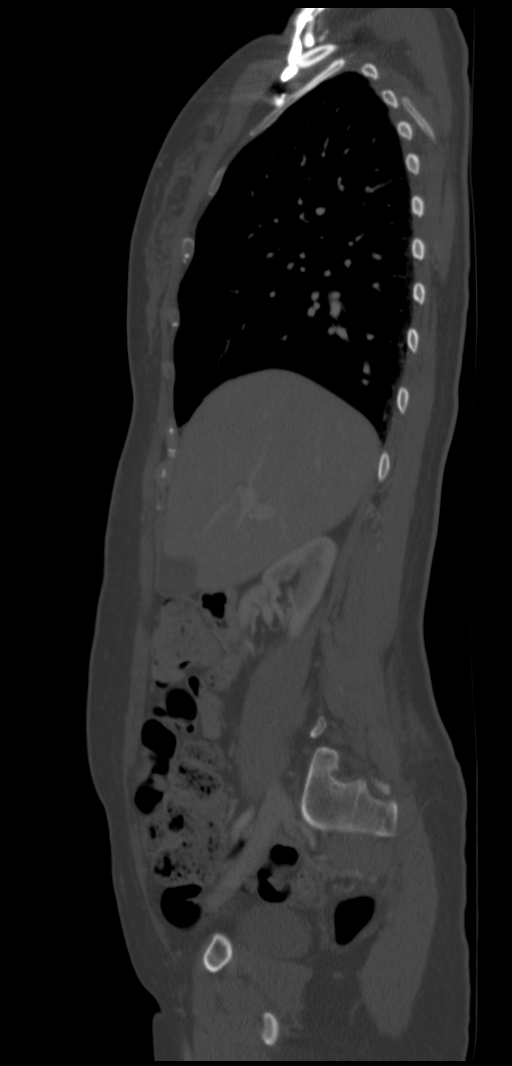
[im 57/113  bone]
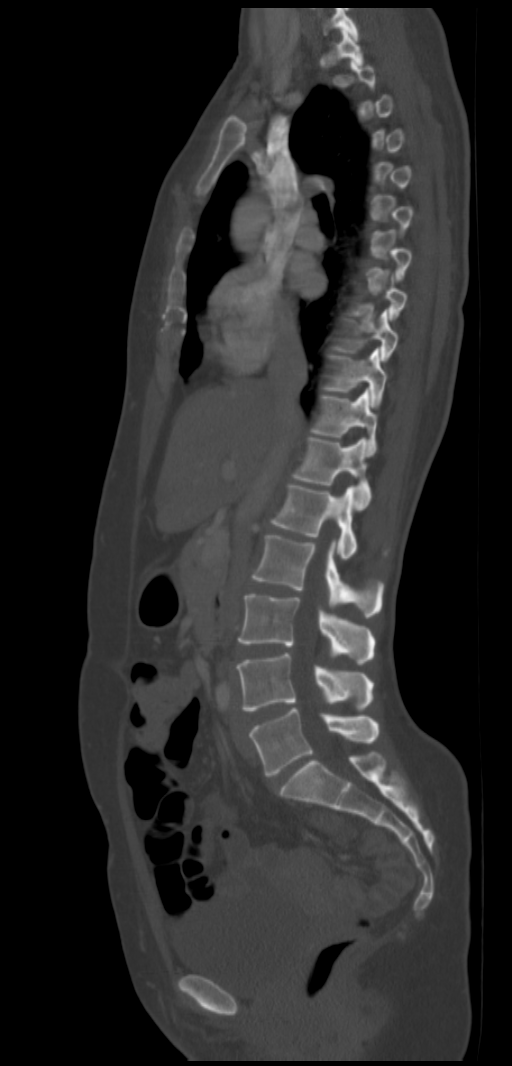
[im 71/113  bone]
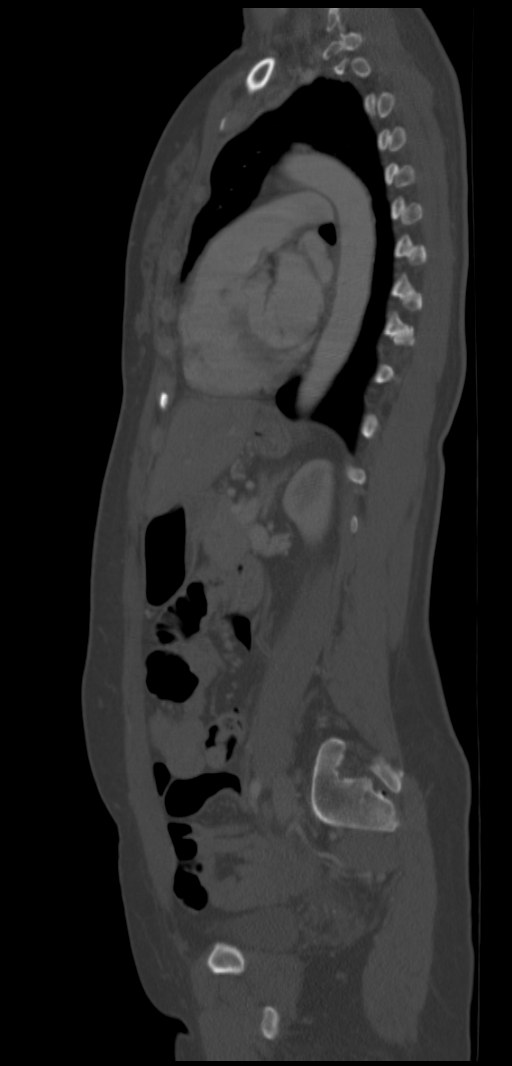
[im 85/113  bone]
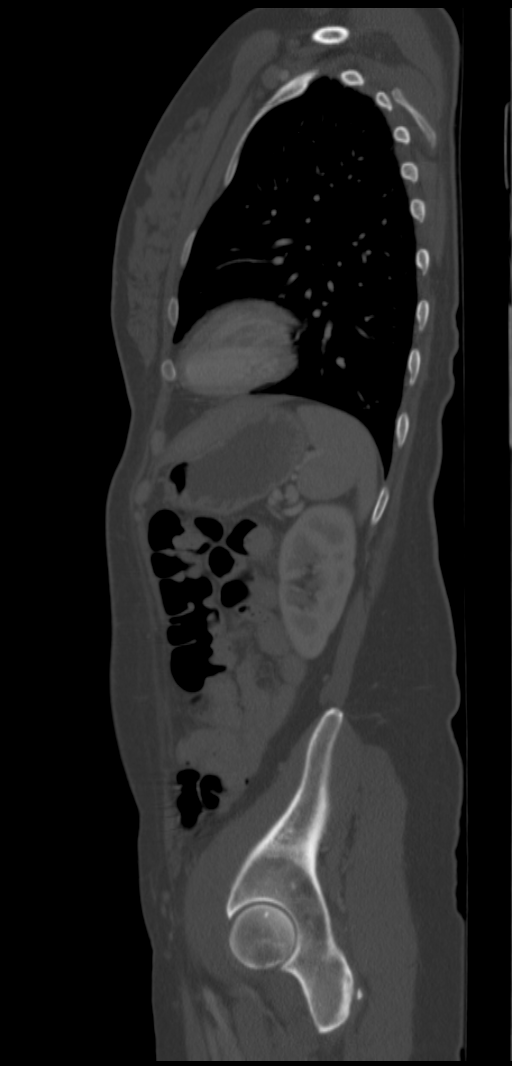

[11 of 33 positions shown; findings below may reference images not displayed]

FINDINGS: There is mild degenerative change, most notable at C5-6.
There is right foraminal narrowing at this level.  There is no
evidence for acute fracture or subluxation.  Alignment is within
normal limits.  Images of the lung apices are unremarkable.
IMPRESSION: 1.  Degenerative change, most notable at C5-6.
2. No evidence for acute  abnormality.

## 2012-01-15 IMAGING — CR DG TIBIA/FIBULA 2V*R*
4 series · 4 of 4 positions shown · non-contrast
Comparison: None.

CLINICAL DATA: Right leg pain after fall from horse.

RIGHT TIBIA AND FIBULA - 2 VIEW

[t tib/fib ap right (1 of 2)]
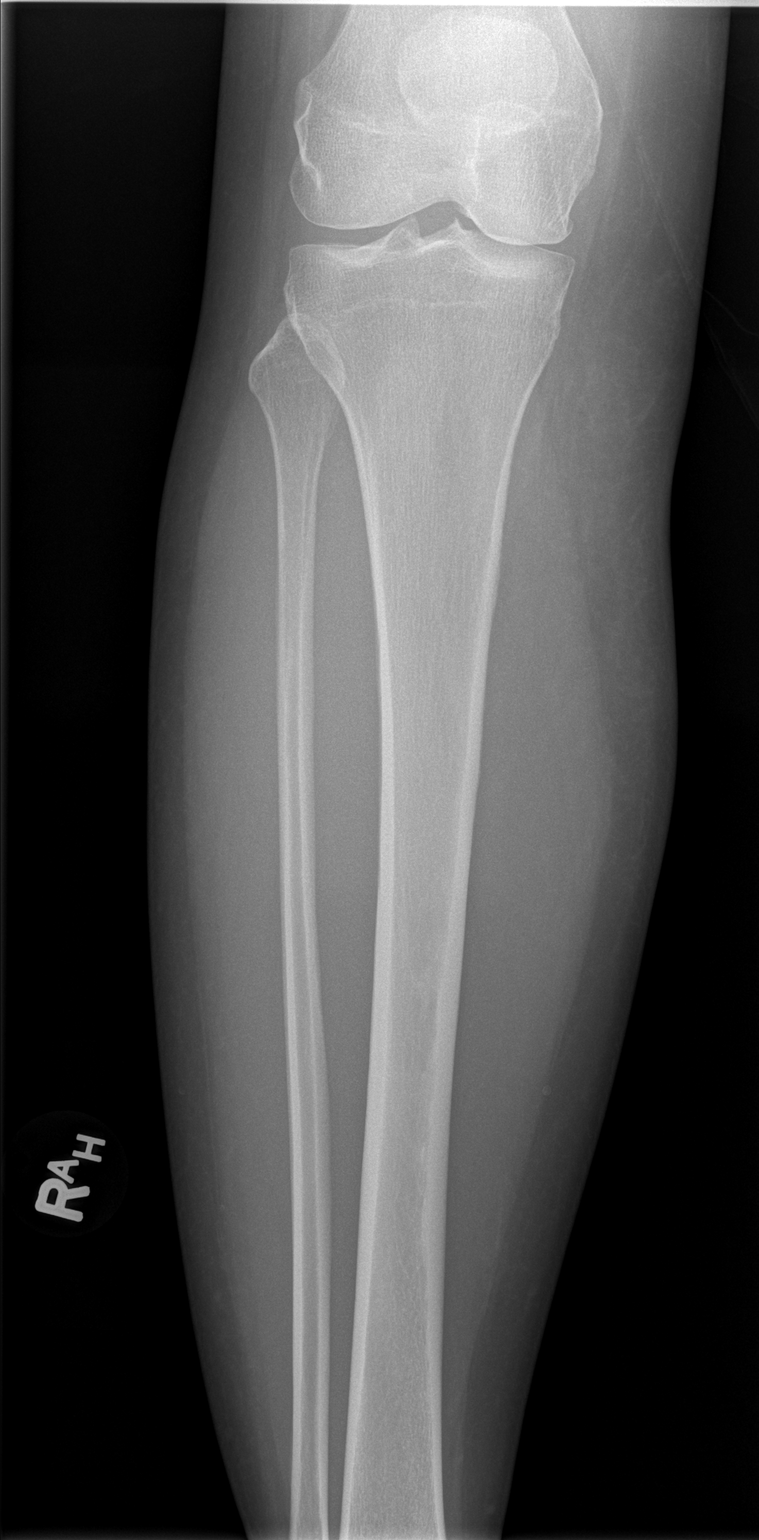

[t tib/fib ap right (2 of 2)]
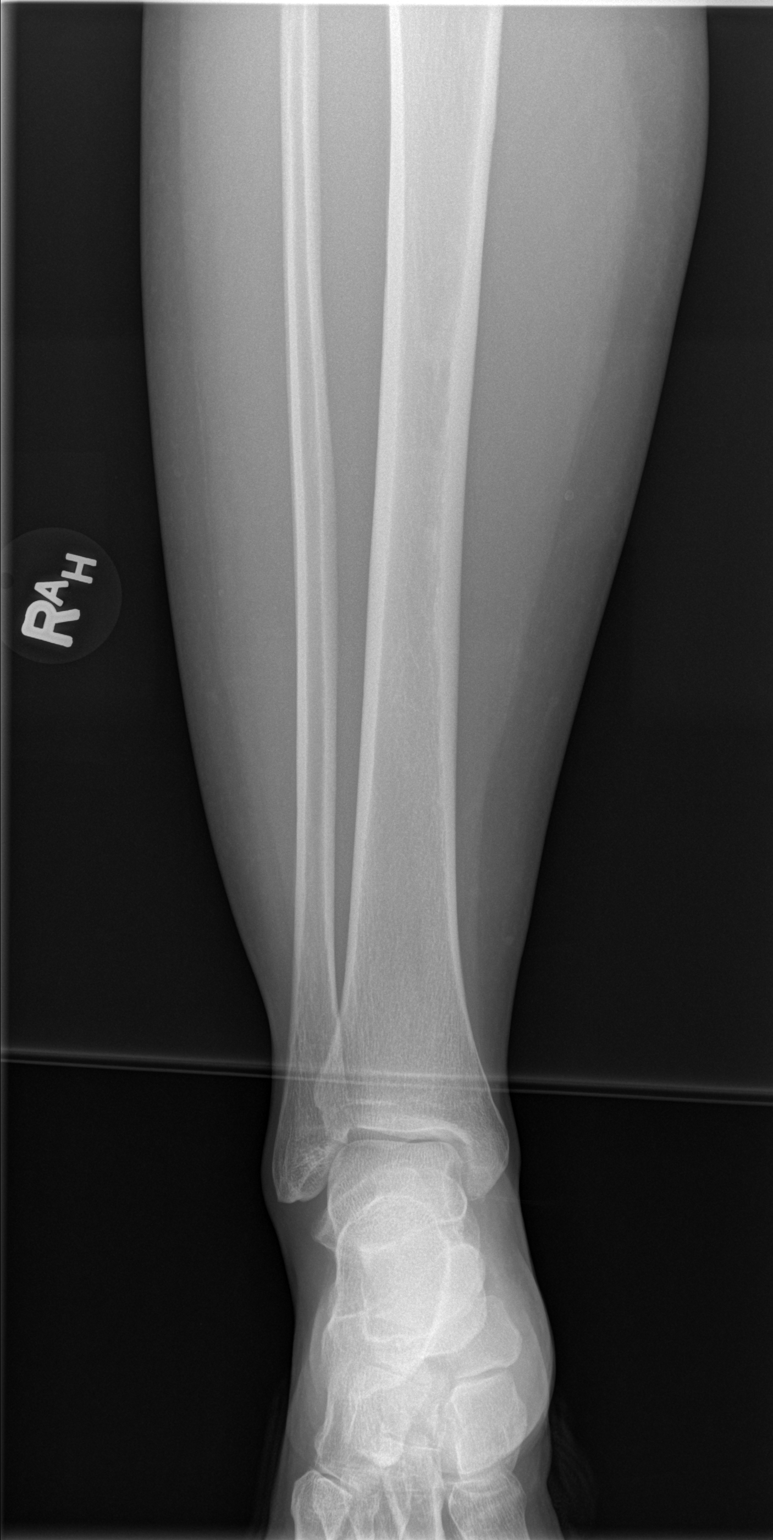

[t tib/fib lat right (1 of 2)]
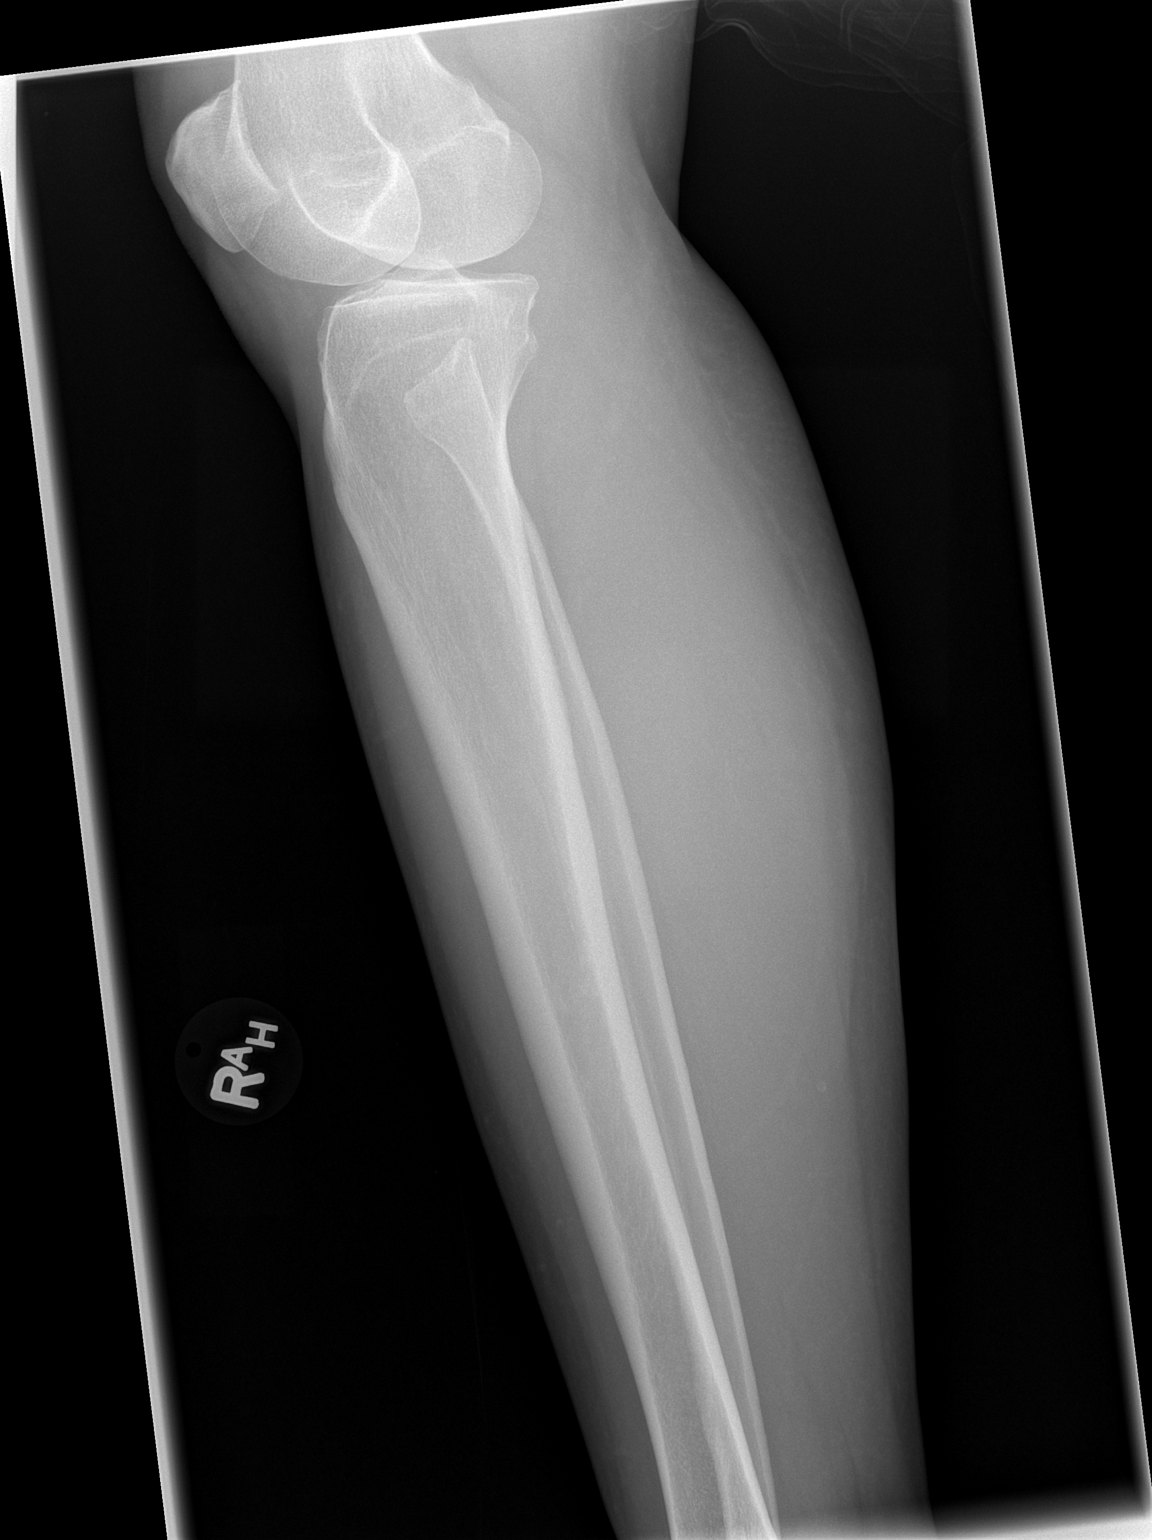

[t tib/fib lat right (2 of 2)]
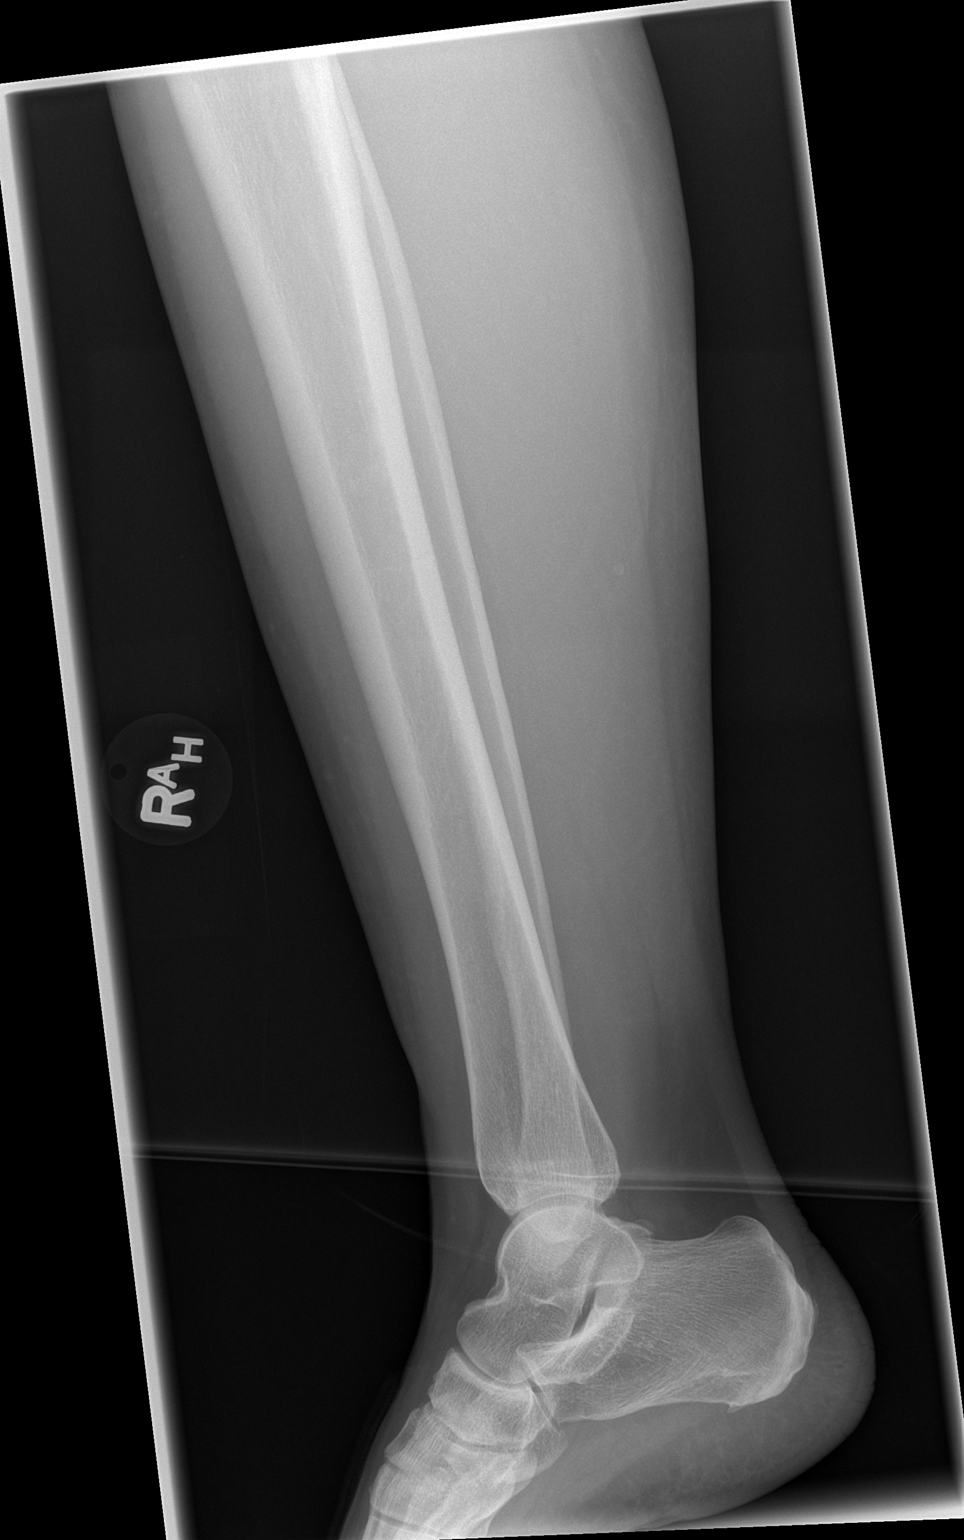

[4 of 4 positions shown; findings below may reference images not displayed]

FINDINGS: The right tibia and fibula appear intact.  No evidence of
acute fracture or subluxation.  No focal bone lesion or bone
destruction.  Bone cortex and trabecular architecture appear
intact.  No abnormal radiopaque foreign bodies in the soft tissues.
IMPRESSION: No acute bony abnormalities identified.

## 2012-08-02 ENCOUNTER — Other Ambulatory Visit (HOSPITAL_COMMUNITY): Payer: Self-pay | Admitting: Obstetrics and Gynecology

## 2012-08-02 DIAGNOSIS — Z1231 Encounter for screening mammogram for malignant neoplasm of breast: Secondary | ICD-10-CM

## 2012-09-01 ENCOUNTER — Ambulatory Visit (HOSPITAL_COMMUNITY): Payer: 59

## 2012-09-08 ENCOUNTER — Ambulatory Visit (HOSPITAL_COMMUNITY): Payer: 59

## 2012-09-22 ENCOUNTER — Ambulatory Visit (HOSPITAL_COMMUNITY): Payer: 59

## 2012-10-06 ENCOUNTER — Ambulatory Visit (HOSPITAL_COMMUNITY)
Admission: RE | Admit: 2012-10-06 | Discharge: 2012-10-06 | Disposition: A | Discharge status: Home / Self Care | Payer: 59 | Source: Ambulatory Visit | Attending: Obstetrics and Gynecology | Admitting: Obstetrics and Gynecology

## 2012-10-06 DIAGNOSIS — Z1231 Encounter for screening mammogram for malignant neoplasm of breast: Secondary | ICD-10-CM | POA: Insufficient documentation

## 2012-12-20 ENCOUNTER — Encounter: Payer: Self-pay | Admitting: Family Medicine

## 2012-12-20 ENCOUNTER — Ambulatory Visit (INDEPENDENT_AMBULATORY_CARE_PROVIDER_SITE_OTHER): Payer: 59 | Admitting: Family Medicine

## 2012-12-20 VITALS — BP 111/76 | HR 102 | Ht 66.0 in | Wt 128.0 lb

## 2012-12-20 DIAGNOSIS — M25529 Pain in unspecified elbow: Secondary | ICD-10-CM

## 2012-12-20 DIAGNOSIS — M25521 Pain in right elbow: Secondary | ICD-10-CM

## 2012-12-20 DIAGNOSIS — M722 Plantar fascial fibromatosis: Secondary | ICD-10-CM

## 2012-12-20 NOTE — Patient Instructions (Addendum)
You have plantar fasciitis Take tylenol or aleve as needed for pain  Plantar fascia stretch for 20-30 seconds (do 3 of these) in morning Lowering/raise on a step exercises 3 x 10 once or twice a day - this is very important for long term recovery. Can add heel walks, toe walks forward and backward as well Ice heel for 15 minutes as needed. Avoid flat shoes/barefoot walking as much as possible. Arch straps have been shown to help with pain. Orthotics may be helpful - use these regularly in a well supported shoe. Steroid injection is a consideration for short term pain relief if you are struggling - you were given this today. Physical therapy is also an option.  You have lateral and medial epicondylitis Try to avoid painful activities as much as possible. Ice the areas 3-4 times a day for 15 minutes at a time (be careful around the funny bone nerve though). Tylenol or aleve as needed for pain. Counterforce brace as directed can help unload area - wear this regularly if it provides you with relief. Home Pronation/supination with 1 pound weight (or hammer), wrist extension, stretching - do these once a day 3 sets of 10. Consider formal physical therapy. Consider injection for short term pain relief if the above is not helping. Follow up with me in 6 weeks for reevaluation.

## 2012-12-25 ENCOUNTER — Encounter: Payer: Self-pay | Admitting: Family Medicine

## 2012-12-25 DIAGNOSIS — M722 Plantar fascial fibromatosis: Secondary | ICD-10-CM | POA: Insufficient documentation

## 2012-12-25 DIAGNOSIS — M25521 Pain in right elbow: Secondary | ICD-10-CM | POA: Insufficient documentation

## 2012-12-25 NOTE — Assessment & Plan Note (Signed)
2/2 lateral and medial epicondylitis.  Start home exercise program.  Icing, tylenol/nsaids as needed.  Counterforce brace.  Consider injection, nitro patches if not improving.  F/u in 6 weeks.

## 2012-12-25 NOTE — Assessment & Plan Note (Signed)
Start home exercise program and stretches.  Icing, arch straps, orthotics.  Injection given today as a series of these helped her in the past.  Consider PT if not improving.  After informed written consent patient was seated on exam table.  Medial aspect near plantar fascia insertion on calcaneus prepped with alcohol swab and plantar fascia injected with 2:1 marcaine: depomedrol with 4 needle fenestrations.  Patient tolerated procedure well without immediate complications.

## 2012-12-25 NOTE — Progress Notes (Signed)
Patient ID: Shelley Pearson, female   DOB: [birth year], 52 y.o.   MRN: [id number]  PCP: No primary provider on file.  Subjective:   HPI: Patient is a 52 y.o. female here for right foot and elbow pain.  Patient states back in 2009 she had a lot of problems with right foot plantar fasciitis. Had tried 3 cortisone shots, walking boot, orthotics, tennis shoe. Finally this went away until the past couple months when this has recurred. Doing some stretching on her own. Also reports right elbow pain on inside and outside parts of this. Some numbness or tingling but not similar to her prior carpal tunnel syndrome (had surgery before). Picking up items causes pain. Taking tylenol. Not using ice or heat.  Past Medical History  Diagnosis Date  . Asthma   . History of recurrent UTIs     Current Outpatient Prescriptions on File Prior to Visit  Medication Sig Dispense Refill  . albuterol (PROVENTIL HFA;VENTOLIN HFA) 108 (90 BASE) MCG/ACT inhaler Inhale 2 puffs into the lungs every 6 (six) hours as needed. For shortness of breath or wheezing        . escitalopram (LEXAPRO) 20 MG tablet Take 20 mg by mouth daily.        . phentermine 37.5 MG capsule Take 37.5 mg by mouth every morning.         No current facility-administered medications on file prior to visit.    Past Surgical History  Procedure Laterality Date  . Hand surgery    . Bladder surgery    . Endometrial ablation    . Appendectomy      Allergies  Allergen Reactions  . Suprax (Cefixime) Hives    History   Social History  . Marital Status: Married    Spouse Name: N/A    Number of Children: N/A  . Years of Education: N/A   Occupational History  . Not on file.   Social History Main Topics  . Smoking status: Never Smoker   . Smokeless tobacco: Not on file  . Alcohol Use: Yes  . Drug Use: Not on file  . Sexually Active: Not on file   Other Topics Concern  . Not on file   Social History Narrative  . No  narrative on file    Family History  Problem Relation Age of Onset  . Diabetes Mother   . Hyperlipidemia Mother   . Hypertension Mother   . Heart attack Neg Hx   . Sudden death Neg Hx     BP 111/76  Pulse 102  Ht 5\' 6"  (1.676 m)  Wt 128 lb (58.06 kg)  BMI 20.67 kg/m2  LMP 04/22/2011  Review of Systems: See HPI above.    Objective:  Physical Exam:  Gen: NAD  R elbow: No gross deformity, swelling, bruising. FROM elbow and wrist.  Pain on wrist flexion, extension, and 3rd digit extension at lateral and medial epicondyles. TTP medial and lateral epicondyles.  Negative tinels at carpal, cubital, radial tunnels. Collateral ligaments intact. NVI distally.  R foot/ankle: No gross deformity, swelling, ecchymoses FROM TTP plantar fascia at calcaneus insertion medially. Negative ant drawer and talar tilt.   Negative syndesmotic compression. Thompsons test negative. NV intact distally.    Assessment & Plan:  1. Plantar fasciitis - Start home exercise program and stretches.  Icing, arch straps, orthotics.  Injection given today as a series of these helped her in the past.  Consider PT if not improving.  After informed written  consent patient was seated on exam table.  Medial aspect near plantar fascia insertion on calcaneus prepped with alcohol swab and plantar fascia injected with 2:1 marcaine: depomedrol with 4 needle fenestrations.  Patient tolerated procedure well without immediate complications.  2. Right elbow pain - 2/2 lateral and medial epicondylitis.  Start home exercise program.  Icing, tylenol/nsaids as needed.  Counterforce brace.  Consider injection, nitro patches if not improving.  F/u in 6 weeks.

## 2013-02-01 ENCOUNTER — Ambulatory Visit: Payer: 59 | Admitting: Family Medicine

## 2013-02-02 ENCOUNTER — Encounter: Payer: Self-pay | Admitting: Family Medicine

## 2013-02-02 ENCOUNTER — Ambulatory Visit (INDEPENDENT_AMBULATORY_CARE_PROVIDER_SITE_OTHER): Payer: 59 | Admitting: Family Medicine

## 2013-02-02 VITALS — BP 113/72 | HR 68 | Ht 65.0 in | Wt 128.0 lb

## 2013-02-02 DIAGNOSIS — M722 Plantar fascial fibromatosis: Secondary | ICD-10-CM

## 2013-02-02 NOTE — Patient Instructions (Addendum)
You have plantar fasciitis Take tylenol or aleve as needed for pain  Plantar fascia stretch for 20-30 seconds (do 3 of these) in morning Lowering/raise on a step exercises 3 x 10 once or twice a day - this is very important for long term recovery. Can add heel walks, toe walks forward and backward as well Ice heel for 15 minutes as needed. Avoid flat shoes/barefoot walking as much as possible. Arch straps have been shown to help with pain. Orthotics may be helpful - flip these over and switch the shoes they're in. Steroid injection is a consideration for short term pain relief if you are struggling - you were given this today in both fasciae. Call us if you want to go ahead with physical therapy. The names of the people who do active release/plantar fascia massage treatment are Thereasa Distance ([phone]) and Waynetta Pean [phone]) Follow up with me in 6 weeks.

## 2013-02-05 ENCOUNTER — Encounter: Payer: Self-pay | Admitting: Family Medicine

## 2013-02-05 NOTE — Progress Notes (Signed)
Patient ID: Shelley Pearson, female   DOB: Nov 09, 1960, 52 y.o.   MRN: [id number]  PCP: Thayer Headings, MD  Subjective:   HPI: Patient is a 52 y.o. female here for f/u right foot pain.  6/25: Patient states back in 2009 she had a lot of problems with right foot plantar fasciitis. Had tried 3 cortisone shots, walking boot, orthotics, tennis shoe. Finally this went away until the past couple months when this has recurred. Doing some stretching on her own. Also reports right elbow pain on inside and outside parts of this. Some numbness or tingling but not similar to her prior carpal tunnel syndrome (had surgery before). Picking up items causes pain. Taking tylenol. Not using ice or heat.  8/8: Patient reports her right foot felt great for a little while (several weeks) then pain started to come back. States she's using the arch binders and doing the home exercises. No new injuries. Interested in repeating injections for both sides now.  Past Medical History  Diagnosis Date  . Asthma   . History of recurrent UTIs     Current Outpatient Prescriptions on File Prior to Visit  Medication Sig Dispense Refill  . albuterol (PROVENTIL HFA;VENTOLIN HFA) 108 (90 BASE) MCG/ACT inhaler Inhale 2 puffs into the lungs every 6 (six) hours as needed. For shortness of breath or wheezing        . escitalopram (LEXAPRO) 20 MG tablet Take 20 mg by mouth daily.        . Fluticasone-Salmeterol (ADVAIR) 250-50 MCG/DOSE AEPB Inhale 1 puff into the lungs every 12 (twelve) hours.      . phentermine 37.5 MG capsule Take 37.5 mg by mouth every morning.         No current facility-administered medications on file prior to visit.    Past Surgical History  Procedure Laterality Date  . Hand surgery    . Bladder surgery    . Endometrial ablation    . Appendectomy      Allergies  Allergen Reactions  . Suprax (Cefixime) Hives    History   Social History  . Marital Status: Married    Spouse Name:  N/A    Number of Children: N/A  . Years of Education: N/A   Occupational History  . Not on file.   Social History Main Topics  . Smoking status: Never Smoker   . Smokeless tobacco: Not on file  . Alcohol Use: Yes  . Drug Use: Not on file  . Sexually Active: Not on file   Other Topics Concern  . Not on file   Social History Narrative  . No narrative on file    Family History  Problem Relation Age of Onset  . Diabetes Mother   . Hyperlipidemia Mother   . Hypertension Mother   . Heart attack Neg Hx   . Sudden death Neg Hx     BP 113/72  Pulse 68  Ht 5\' 5"  (1.651 m)  Wt 128 lb (58.06 kg)  BMI 21.3 kg/m2  LMP 04/22/2011  Review of Systems: See HPI above.    Objective:  Physical Exam:  Gen: NAD  L foot/ankle: No gross deformity, swelling, ecchymoses FROM TTP plantar fascia at calcaneus insertion medially. Negative ant drawer and talar tilt.   Negative syndesmotic compression. Thompsons test negative. NV intact distally.  R foot/ankle: No gross deformity, swelling, ecchymoses FROM Minimal TTP plantar fascia at calcaneus insertion medially. Negative ant drawer and talar tilt.   Negative syndesmotic compression. Thompsons test  negative. NV intact distally.    Assessment & Plan:  1. Plantar fasciitis - Continue home exercise program and stretches.  Icing, arch straps, orthotics.  Declined formal PT.  Repeat injection but given in both feet today.  Consider PT if not improving.  F/u in 6 weeks.  After informed written consent patient was seated on exam table.  Medial aspect near left plantar fascia insertion on calcaneus prepped with alcohol swab and plantar fascia injected with 2:1 marcaine: depomedrol with 4 needle fenestrations.  Patient tolerated procedure well without immediate complications.  After informed written consent patient was seated on exam table.  Medial aspect near right plantar fascia insertion on calcaneus prepped with alcohol swab and  plantar fascia injected with 2:1 marcaine: depomedrol with 4 needle fenestrations.  Patient tolerated procedure well without immediate complications.

## 2013-02-05 NOTE — Assessment & Plan Note (Signed)
Continue home exercise program and stretches.  Icing, arch straps, orthotics.  Declined formal PT.  Repeat injection but given in both feet today.  Consider PT if not improving.  F/u in 6 weeks.  After informed written consent patient was seated on exam table.  Medial aspect near left plantar fascia insertion on calcaneus prepped with alcohol swab and plantar fascia injected with 2:1 marcaine: depomedrol with 4 needle fenestrations.  Patient tolerated procedure well without immediate complications.  After informed written consent patient was seated on exam table.  Medial aspect near right plantar fascia insertion on calcaneus prepped with alcohol swab and plantar fascia injected with 2:1 marcaine: depomedrol with 4 needle fenestrations.  Patient tolerated procedure well without immediate complications.

## 2013-03-15 ENCOUNTER — Ambulatory Visit: Payer: 59 | Admitting: Family Medicine

## 2013-09-04 ENCOUNTER — Other Ambulatory Visit (HOSPITAL_COMMUNITY): Payer: Self-pay | Admitting: Obstetrics and Gynecology

## 2013-09-04 DIAGNOSIS — Z1231 Encounter for screening mammogram for malignant neoplasm of breast: Secondary | ICD-10-CM

## 2013-10-10 ENCOUNTER — Ambulatory Visit (HOSPITAL_COMMUNITY): Payer: 59

## 2013-11-13 ENCOUNTER — Ambulatory Visit (HOSPITAL_COMMUNITY): Payer: 59

## 2013-11-15 ENCOUNTER — Ambulatory Visit (HOSPITAL_COMMUNITY): Payer: 59 | Attending: Obstetrics and Gynecology

## 2013-12-04 ENCOUNTER — Ambulatory Visit (HOSPITAL_COMMUNITY)
Admission: RE | Admit: 2013-12-04 | Discharge: 2013-12-04 | Disposition: A | Discharge status: Home / Self Care | Payer: 59 | Source: Ambulatory Visit | Attending: Obstetrics and Gynecology | Admitting: Obstetrics and Gynecology

## 2013-12-04 DIAGNOSIS — Z1231 Encounter for screening mammogram for malignant neoplasm of breast: Secondary | ICD-10-CM | POA: Insufficient documentation

## 2014-08-02 ENCOUNTER — Other Ambulatory Visit (HOSPITAL_COMMUNITY): Payer: Self-pay | Admitting: Obstetrics and Gynecology

## 2014-08-02 DIAGNOSIS — Z1231 Encounter for screening mammogram for malignant neoplasm of breast: Secondary | ICD-10-CM

## 2014-08-27 ENCOUNTER — Encounter (HOSPITAL_COMMUNITY): Payer: Self-pay | Admitting: Emergency Medicine

## 2014-08-27 ENCOUNTER — Emergency Department (INDEPENDENT_AMBULATORY_CARE_PROVIDER_SITE_OTHER): Payer: 59

## 2014-08-27 ENCOUNTER — Emergency Department (HOSPITAL_COMMUNITY)
Admission: EM | Admit: 2014-08-27 | Discharge: 2014-08-27 | Disposition: A | Discharge status: Home / Self Care | Payer: 59 | Source: Home / Self Care | Attending: Family Medicine | Admitting: Family Medicine

## 2014-08-27 DIAGNOSIS — M7672 Peroneal tendinitis, left leg: Secondary | ICD-10-CM

## 2014-08-27 MED ORDER — DICLOFENAC SODIUM 75 MG PO TBEC: 75.0000 mg | DELAYED_RELEASE_TABLET | Freq: Two times a day (BID) | ORAL | Status: DC | PRN

## 2014-08-27 NOTE — Discharge Instructions (Signed)
Thank you for coming in today. Use the ankle brace Take diclofenac twice daily for pain as needed Follow-up with Dr. Pearletha ForgeHudnall Return as needed  Peroneal Tendinitis with Rehab Tendonitis is inflammation of a tendon. Inflammation of the tendons on the back of the outer ankle (peroneal tendons) is known as peroneal tendonitis. The peroneal tendons are responsible for connecting the muscles that allow you to stand on your tiptoes to the bones of the ankle. For this reason, peroneal tendonitis often causes pain when trying to complete such motions. Peroneal tendonitis often involves a tear (strain) of the peroneal tendons. Strains are classified into three categories. Grade 1 strains cause pain, but the tendon is not lengthened. Grade 2 strains include a lengthened ligament, due to the ligament being stretched or partially ruptured. With grade 2 strains there is still function, although function may be decreased. Grade 3 strains involve a complete tear of the tendon or muscle, and function is usually impaired. SYMPTOMS   Pain, tenderness, swelling, warmth, or redness over the back of the outer side of the ankle, the outer part of the mid-foot, or the bottom of the arch.  Pain that gets worse with ankle motion (especially when pushing off or pushing down with the front of the foot), or when standing on the ball of the foot or pushing the foot outward.  Crackling sound (crepitation) when the tendon is moved or touched. CAUSES  Peroneal tendinitis occurs when injury to the peroneal tendons causes the body to respond with inflammation. Common causes of injury include:  An overuse injury, in which the groove behind the outer ankle (where the tendon is located) causes wear on the tendon.  A sudden stress placed on the tendon, such as from an increase in the intensity, frequency, or duration of training.  Direct hit (trauma) to the tendon.  Return to activity too soon after a previous ankle injury. RISK  INCREASES WITH:  Sports that require sudden, repetitive pushing off of the foot, such as jumping or quick starts.  Kicking and running sports, especially running down hills or long distances.  Poor strength and flexibility.  Previous injury to the foot, ankle, or leg. PREVENTION  Warm up and stretch properly before activity.  Allow for adequate recovery between workouts.  Maintain physical fitness:  Strength, flexibility, and endurance.  Cardiovascular fitness.  Complete rehabilitation after previous injury. PROGNOSIS  If treated properly, peroneal tendonitis usually heals within 6 weeks.  RELATED COMPLICATIONS  Longer healing time, if not properly treated or if not given enough time to heal.  Recurring symptoms if activity is resumed too soon, with overuse, or when using poor technique.  If untreated, tendinitis may result in tendon rupture, requiring surgery. TREATMENT  Treatment first involves the use of ice and medicine to reduce pain and inflammation. The use of strengthening and stretching exercises may help reduce pain with activity. These exercises may be performed at home or with a therapist. Sometimes, the foot and ankle will be restrained for 10 to 14 days to promote healing. Your caregiver may advise that you place a heel lift in your shoes to reduce the stress placed on the tendon. If nonsurgical treatment is unsuccessful, surgery to remove the inflamed tendon lining (sheath) may be advised.  MEDICATION   If pain medicine is needed, nonsteroidal anti-inflammatory medicines (aspirin and ibuprofen), or other minor pain relievers (acetaminophen), are often advised.  Do not take pain medicine for 7 days before surgery.  Prescription pain relievers may be given,  if your caregiver thinks they are needed. Use only as directed and only as much as you need. HEAT AND COLD  Cold treatment (icing) should be applied for 10 to 15 minutes every 2 to 3 hours for inflammation  and pain, and immediately after activity that aggravates your symptoms. Use ice packs or an ice massage.  Heat treatment may be used before performing stretching and strengthening activities prescribed by your caregiver, physical therapist, or athletic trainer. Use a heat pack or a warm water soak. SEEK MEDICAL CARE IF:  Symptoms get worse or do not improve in 2 to 4 weeks, despite treatment.  New, unexplained symptoms develop. (Drugs used in treatment may produce side effects.) EXERCISES RANGE OF MOTION (ROM) AND STRETCHING EXERCISES - Peroneal Tendinitis These exercises may help you when beginning to rehabilitate your injury. Your symptoms may resolve with or without further involvement from your physician, physical therapist or athletic trainer. While completing these exercises, remember:   Restoring tissue flexibility helps normal motion to return to the joints. This allows healthier, less painful movement and activity.  An effective stretch should be held for at least 30 seconds.  A stretch should never be painful. You should only feel a gentle lengthening or release in the stretched tissue. RANGE OF MOTION - Ankle Eversion  Sit with your right / left ankle crossed over your opposite knee.  Grip your foot with your opposite hand, placing your thumb on the top of your foot and your fingers across the bottom of your foot.  Gently push your foot downward with a slight rotation, so your littlest toes rise slightly toward the ceiling.  You should feel a gentle stretch on the inside of your ankle. Hold the stretch for __________ seconds. Repeat __________ times. Complete this exercise __________ times per day.  RANGE OF MOTION - Ankle Inversion  Sit with your right / left ankle crossed over your opposite knee.  Grip your foot with your opposite hand, placing your thumb on the bottom of your foot and your fingers across the top of your foot.  Gently pull your foot so the smallest toe  comes toward you and your thumb pushes the inside of the ball of your foot away from you.  You should feel a gentle stretch on the outside of your ankle. Hold the stretch for __________ seconds. Repeat __________ times. Complete this exercise __________ times per day.  RANGE OF MOTION - Ankle Plantar Flexion  Sit with your right / left leg crossed over your opposite knee.  Use your opposite hand to pull the top of your foot and toes toward you.  You should feel a gentle stretch on the top of your foot and ankle. Hold this position for __________ seconds. Repeat __________ times. Complete __________ times per day.  STRETCH - Gastroc, Standing  Place your hands on a wall.  Extend your right / left leg behind you, keeping the front knee somewhat bent.  Slightly point your toes inward on your back foot.  Keeping your right / left heel on the floor and your knee straight, shift your weight toward the wall, not allowing your back to arch.  You should feel a gentle stretch in the calf. Hold this position for __________ seconds. Repeat __________ times. Complete this stretch __________ times per day. STRETCH - Soleus, Standing  Place your hands on a wall.  Extend your right / left leg behind you, keeping the other knee somewhat bent.  Slightly point your toes inward  on your back foot.  Keep your heel on the floor, bend your back knee, and slightly shift your weight over the back leg so that you feel a gentle stretch deep in your back calf.  Hold this position for __________ seconds. Repeat __________ times. Complete this stretch __________ times per day. STRETCH - Gastrocsoleus, Standing Note: This exercise can place a lot of stress on your foot and ankle. Please complete this exercise only if specifically instructed by your caregiver.   Place the ball of your right / left foot on a step, keeping your other foot firmly on the same step.  Hold on to the wall or a rail for  balance.  Slowly lift your other foot, allowing your body weight to press your heel down over the edge of the step.  You should feel a stretch in your right / left calf.  Hold this position for __________ seconds.  Repeat this exercise with a slight bend in your knee. Repeat __________ times. Complete this stretch __________ times per day.  STRENGTHENING EXERCISES - Peroneal Tendinitis  These exercises may help you when beginning to rehabilitate your injury. They may resolve your symptoms with or without further involvement from your physician, physical therapist or athletic trainer. While completing these exercises, remember:   Muscles can gain both the endurance and the strength needed for everyday activities through controlled exercises.  Complete these exercises as instructed by your physician, physical therapist or athletic trainer. Increase the resistance and repetitions only as guided by your caregiver. STRENGTH - Dorsiflexors  Secure a rubber exercise band or tubing to a fixed object (table, pole) and loop the other end around your right / left foot.  Sit on the floor facing the fixed object. The band should be slightly tense when your foot is relaxed.  Slowly draw your foot back toward you, using your ankle and toes.  Hold this position for __________ seconds. Slowly release the tension in the band and return your foot to the starting position. Repeat __________ times. Complete this exercise __________ times per day.  STRENGTH - Towel Curls  Sit in a chair, on a non-carpeted surface.  Place your foot on a towel, keeping your heel on the floor.  Pull the towel toward your heel only by curling your toes. Keep your heel on the floor.  If instructed by your physician, physical therapist or athletic trainer, add weight to the end of the towel. Repeat __________ times. Complete this exercise __________ times per day. STRENGTH - Ankle Eversion   Secure one end of a rubber  exercise band or tubing to a fixed object (table, pole). Loop the other end around your foot, just before your toes.  Place your fists between your knees. This will focus your strengthening at your ankle.  Drawing the band across your opposite foot, away from the pole, slowly, pull your little toe out and up. Make sure the band is positioned to resist the entire motion.  Hold this position for __________ seconds.  Have your muscles resist the band, as it slowly pulls your foot back to the starting position. Repeat __________ times. Complete this exercise __________ times per day.  Document Released: 06/14/2005 Document Revised: 10/29/2013 Document Reviewed: 09/26/2008 [facility] Patient Information 2015 [place], [place]. This information is not intended to replace advice given to you by your health care provider. Make sure you discuss any questions you have with your health care provider.

## 2014-08-27 NOTE — ED Provider Notes (Signed)
Shelley Pearson is a 54 y.o. female who presents to Urgent Care today for left ankle pain. Patient suffered an inversion injury to her left ankle one and a half weeks ago at her parent's house. She was recovering until yesterday when she again suffered a inversion injury at home tripping over a cat. She notes pain on the lateral area of her ankle. She denies any significant foot pain. No radiating pain weakness or numbness. She has tried some over-the-counter medications which help a little.   Past Medical History  Diagnosis Date  . Asthma   . History of recurrent UTIs    Past Surgical History  Procedure Laterality Date  . Hand surgery    . Bladder surgery    . Endometrial ablation    . Appendectomy     History  Substance Use Topics  . Smoking status: Never Smoker   . Smokeless tobacco: Not on file  . Alcohol Use: Yes   ROS as above Medications: No current facility-administered medications for this encounter.   Current Outpatient Prescriptions  Medication Sig Dispense Refill  . albuterol (PROVENTIL HFA;VENTOLIN HFA) 108 (90 BASE) MCG/ACT inhaler Inhale 2 puffs into the lungs every 6 (six) hours as needed. For shortness of breath or wheezing      . diclofenac (VOLTAREN) 75 MG EC tablet Take 1 tablet (75 mg total) by mouth 2 (two) times daily as needed. 30 tablet 0  . escitalopram (LEXAPRO) 20 MG tablet Take 20 mg by mouth daily.      . Fluticasone-Salmeterol (ADVAIR) 250-50 MCG/DOSE AEPB Inhale 1 puff into the lungs every 12 (twelve) hours.    . phentermine 37.5 MG capsule Take 37.5 mg by mouth every morning.       Allergies  Allergen Reactions  . Suprax [Cefixime] Hives     Exam:  BP 154/81 mmHg  Pulse 55  Temp(Src) 98.1 F (36.7 C) (Oral)  Resp 16  SpO2 100%  LMP 04/22/2011 Gen: Well NAD HEENT: EOMI,  MMM Lungs: Normal work of breathing. CTABL Heart: RRR no MRG Exts: Brisk capillary refill, warm and well perfused.  Left leg: Knee is nontender with normal  motion Ankle normal-appearing no significant effusion. Tender palpation along the posterior aspect of the lateral malleolus. Stable ligamentous exam Strength is intact Pulses capillary refill and sensation are intact Foot nontender normal motion.  Limited musculoskeletal ultrasound of the left ankle: The peroneal tendon was visualized at the posterior lateral malleolus and was found to be intact but the longus and brevis tendon. The tendon sheath had increased hypoechoic fluid consistent with tenosynovitis. The tendons were visualized to the insertion on the fifth metatarsal and along the feet are portion of the foot. No abnormalities or tears were seen. The posterior tibialis, flexor digitorum longus, and flexor halitosis longus along with the neurovascular bundles were visualized at the medial ankle and at the tarsal tunnel and were normal appearing. The Achilles tendon was visualized. There is some calcifications in hypoechoic fluid at the most distal insertion however the tendon itself is normal-appearing The plantar fascia was visualized and thickened at 0.48 cm and dystrophic appearing there is evidence of bone spurring as well.  Impression:  1) Peroneal tendinitis without significant tear.  2) plantar fasciitis  No results found for this or any previous visit (from the past 24 hour(s)). Dg Ankle Complete Left  08/27/2014   CLINICAL DATA:  Twisted LEFT ankle 1.5 weeks ago and again last night, lateral and posterior pain, history plantar fasciitis  EXAM: LEFT ANKLE COMPLETE - 3+ VIEW  COMPARISON:  None  FINDINGS: Small plantar calcaneal spur.  Ankle mortise intact.  No acute fracture, dislocation or bone destruction.  IMPRESSION: No acute osseous abnormalities.   Electronically Signed   By: Ulyses Southward M.D.   On: 08/27/2014 10:53    Assessment and Plan: 54 y.o. female with peroneal tendinitis with frequent ankle sprains. Treat with ASO brace and home exercise program.  Patient  additionally has plantar fasciitis. This is currently being managed by Dr. Pearletha Forge. Recommend patient return to Dr. Pearletha Forge for further evaluation of plantar fasciitis and further management of peroneal tendinitis. Treat pain with NSAIDs.  Discussed warning signs or symptoms. Please see discharge instructions. Patient expresses understanding.     Rodolph Bong, MD 08/27/14 1134

## 2014-08-27 NOTE — ED Notes (Signed)
Left ankle, foot pain.  Patient reports a fall , missed a step.  Then last night tripped over a cat, re injured ankle/foot

## 2014-09-28 ENCOUNTER — Encounter (HOSPITAL_BASED_OUTPATIENT_CLINIC_OR_DEPARTMENT_OTHER): Payer: Self-pay | Admitting: *Deleted

## 2014-09-28 ENCOUNTER — Emergency Department (HOSPITAL_BASED_OUTPATIENT_CLINIC_OR_DEPARTMENT_OTHER): Payer: 59

## 2014-09-28 ENCOUNTER — Emergency Department (HOSPITAL_BASED_OUTPATIENT_CLINIC_OR_DEPARTMENT_OTHER)
Admission: EM | Admit: 2014-09-28 | Discharge: 2014-09-28 | Disposition: A | Discharge status: Home / Self Care | Payer: 59 | Attending: Emergency Medicine | Admitting: Emergency Medicine

## 2014-09-28 DIAGNOSIS — J45909 Unspecified asthma, uncomplicated: Secondary | ICD-10-CM | POA: Insufficient documentation

## 2014-09-28 DIAGNOSIS — Z87440 Personal history of urinary (tract) infections: Secondary | ICD-10-CM | POA: Diagnosis not present

## 2014-09-28 DIAGNOSIS — Z79899 Other long term (current) drug therapy: Secondary | ICD-10-CM | POA: Diagnosis not present

## 2014-09-28 DIAGNOSIS — J209 Acute bronchitis, unspecified: Secondary | ICD-10-CM

## 2014-09-28 DIAGNOSIS — R05 Cough: Secondary | ICD-10-CM | POA: Diagnosis present

## 2014-09-28 MED ORDER — AZITHROMYCIN 250 MG PO TABS: ORAL_TABLET | ORAL | Status: DC

## 2014-09-28 NOTE — Discharge Instructions (Signed)
Over-the-counter cough and cold medications as needed for symptom relief.  Fill the prescription for Zithromax if you're not improving in the next 2-3 days.   Acute Bronchitis Bronchitis is inflammation of the airways that extend from the windpipe into the lungs (bronchi). The inflammation often causes mucus to develop. This leads to a cough, which is the most common symptom of bronchitis.  In acute bronchitis, the condition usually develops suddenly and goes away over time, usually in a couple weeks. Smoking, allergies, and asthma can make bronchitis worse. Repeated episodes of bronchitis may cause further lung problems.  CAUSES Acute bronchitis is most often caused by the same virus that causes a cold. The virus can spread from person to person (contagious) through coughing, sneezing, and touching contaminated objects. SIGNS AND SYMPTOMS   Cough.   Fever.   Coughing up mucus.   Body aches.   Chest congestion.   Chills.   Shortness of breath.   Sore throat.  DIAGNOSIS  Acute bronchitis is usually diagnosed through a physical exam. Your health care provider will also ask you questions about your medical history. Tests, such as chest X-rays, are sometimes done to rule out other conditions.  TREATMENT  Acute bronchitis usually goes away in a couple weeks. Oftentimes, no medical treatment is necessary. Medicines are sometimes given for relief of fever or cough. Antibiotic medicines are usually not needed but may be prescribed in certain situations. In some cases, an inhaler may be recommended to help reduce shortness of breath and control the cough. A cool mist vaporizer may also be used to help thin bronchial secretions and make it easier to clear the chest.  HOME CARE INSTRUCTIONS  Get plenty of rest.   Drink enough fluids to keep your urine clear or pale yellow (unless you have a medical condition that requires fluid restriction). Increasing fluids may help thin your  respiratory secretions (sputum) and reduce chest congestion, and it will prevent dehydration.   Take medicines only as directed by your health care provider.  If you were prescribed an antibiotic medicine, finish it all even if you start to feel better.  Avoid smoking and secondhand smoke. Exposure to cigarette smoke or irritating chemicals will make bronchitis worse. If you are a smoker, consider using nicotine gum or skin patches to help control withdrawal symptoms. Quitting smoking will help your lungs heal faster.   Reduce the chances of another bout of acute bronchitis by washing your hands frequently, avoiding people with cold symptoms, and trying not to touch your hands to your mouth, nose, or eyes.   Keep all follow-up visits as directed by your health care provider.  SEEK MEDICAL CARE IF: Your symptoms do not improve after 1 week of treatment.  SEEK IMMEDIATE MEDICAL CARE IF:  You develop an increased fever or chills.   You have chest pain.   You have severe shortness of breath.  You have bloody sputum.   You develop dehydration.  You faint or repeatedly feel like you are going to pass out.  You develop repeated vomiting.  You develop a severe headache. MAKE SURE YOU:   Understand these instructions.  Will watch your condition.  Will get help right away if you are not doing well or get worse. Document Released: 07/22/2004 Document Revised: 10/29/2013 Document Reviewed: 12/05/2012 [facility] Patient Information 2015 [employer] [place]. This information is not intended to replace advice given to you by your health care provider. Make sure you discuss any questions you have with your  health care provider. ° °

## 2014-09-28 NOTE — ED Provider Notes (Signed)
CSN: [id number]     Arrival date & time 09/28/14  1452 History  This chart was scribed for Geoffery Lyons, MD by Modena Jansky, ED Scribe. This patient was seen in room MH07/MH07 and the patient's care was started at 4:10 PM.   No chief complaint on file.  Patient is a 54 y.o. female presenting with cough. The history is provided by the patient. No language interpreter was used.  Cough Cough characteristics:  Productive Sputum characteristics:  Green and yellow Severity:  Moderate Onset quality:  Gradual Duration:  1 week Timing:  Intermittent Progression:  Unchanged Chronicity:  New Smoker: no   Relieved by:  Cough suppressants and decongestant Worsened by:  Nothing tried Associated symptoms: fever and sore throat    HPI Comments: Shelley Pearson is a 54 y.o. female who presents to the Emergency Department complaining of intermittent moderate cough that started about a week ago. She states that she has been sick the past week with subjective fever, sore throat, cough, and congestion. Pt's temperature in the ED is 99.2. She reports that the cough is productive of yellow-green sputum. She states that she has been taking tylenol and robitussin with minimal relief. She states that she has a hx of asthma. She denies any sick contacts or hx of smoking.    Past Medical History  Diagnosis Date  . Asthma   . History of recurrent UTIs    Past Surgical History  Procedure Laterality Date  . Hand surgery    . Bladder surgery    . Endometrial ablation    . Appendectomy     Family History  Problem Relation Age of Onset  . Diabetes Mother   . Hyperlipidemia Mother   . Hypertension Mother   . Heart attack Neg Hx   . Sudden death Neg Hx    History  Substance Use Topics  . Smoking status: Never Smoker   . Smokeless tobacco: Not on file  . Alcohol Use: Yes   OB History    No data available     Review of Systems  Constitutional: Positive for fever.  HENT: Positive for congestion and  sore throat.   Respiratory: Positive for cough.   All other systems reviewed and are negative.   Allergies  Suprax  Home Medications   Prior to Admission medications   Medication Sig Start Date End Date Taking? Authorizing Provider  albuterol (PROVENTIL HFA;VENTOLIN HFA) 108 (90 BASE) MCG/ACT inhaler Inhale 2 puffs into the lungs every 6 (six) hours as needed. For shortness of breath or wheezing     Yes Historical Provider, MD  escitalopram (LEXAPRO) 20 MG tablet Take 20 mg by mouth daily.     Yes Historical Provider, MD  Fluticasone-Salmeterol (ADVAIR) 250-50 MCG/DOSE AEPB Inhale 1 puff into the lungs every 12 (twelve) hours.   Yes Historical Provider, MD  diclofenac (VOLTAREN) 75 MG EC tablet Take 1 tablet (75 mg total) by mouth 2 (two) times daily as needed. 08/27/14   Rodolph Bong, MD  phentermine 37.5 MG capsule Take 37.5 mg by mouth every morning.      Historical Provider, MD   BP 125/65 mmHg  Pulse 88  Temp(Src) 99.2 F (37.3 C) (Oral)  Resp 18  Ht  (1.651 m)  Wt 168 lb (76.204 kg)  BMI 27.96 kg/m2  SpO2 98%  LMP 04/22/2011 Physical Exam  Constitutional: She is oriented to person, place, and time. She appears well-developed and well-nourished. No distress.  HENT:  Head:  Normocephalic and atraumatic.  Right Ear: Tympanic membrane normal.  Left Ear: Tympanic membrane normal.  Mouth/Throat: Oropharynx is clear and moist. No oropharyngeal exudate.  Eyes: Conjunctivae and EOM are normal. Pupils are equal, round, and reactive to light.  Neck: Normal range of motion. Neck supple.  Cardiovascular: Normal rate, regular rhythm, normal heart sounds and intact distal pulses.   No murmur heard. Pulmonary/Chest: Effort normal and breath sounds normal. No respiratory distress.  Abdominal: Soft. There is no tenderness. There is no rebound and no guarding.  Musculoskeletal: Normal range of motion. She exhibits no edema or tenderness.  Neurological: She is alert and oriented to  person, place, and time. No cranial nerve deficit. She exhibits normal muscle tone. Coordination normal.  Skin: Skin is warm.  Psychiatric: She has a normal mood and affect. Her behavior is normal.  Nursing note and vitals reviewed.   ED Course  Procedures (including critical care time) DIAGNOSTIC STUDIES: Oxygen Saturation is 98% on RA, normal by my interpretation.    COORDINATION OF CARE: 4:14 PM- Pt advised of plan for treatment which includes radiology and pt agrees.  Labs Review Labs Reviewed - No data to display  Imaging Review Dg Chest 2 View  09/28/2014   CLINICAL DATA:  Head congestion. Productive cough and fever. Asthma.  EXAM: CHEST  2 VIEW  COMPARISON:  06/28/2011  FINDINGS: Old healed right rib fractures. Bony bridging between the left first and second ribs.  The lungs appear clear. Cardiac and mediastinal contours normal. No pleural effusion identified.  IMPRESSION: 1. No acute findings. 2. Old right rib fractures. 3. Bony bridging between the left first and second rib accounts for the vague density in this vicinity.   Electronically Signed   By: Gaylyn RongWalter  Liebkemann M.D.   On: 09/28/2014 15:35     EKG Interpretation None      MDM   Final diagnoses:  None    Patient presents with a greater than one-week history of chest congestion and cough productive of a yellow green sputum. Her physical examination is unremarkable and chest x-ray reveals no acute infiltrate. Will treat with zithromax for presumed bronchitis.  Prn return.  I personally performed the services described in this documentation, which was scribed in my presence. The recorded information has been reviewed and is accurate.       Geoffery Lyonsouglas Kashif Pooler, MD 09/28/14 [phone]

## 2014-09-28 NOTE — ED Notes (Signed)
MD at bedside. 

## 2014-09-28 NOTE — ED Notes (Signed)
C/o cold sx x 1 week. Cough up yellow/green sputum and head congestion. Fever.

## 2014-10-31 ENCOUNTER — Telehealth: Payer: Self-pay | Admitting: Internal Medicine

## 2014-10-31 NOTE — Telephone Encounter (Signed)
Called and spoke to pt. Pt requesting to be seen sooner than 5/17 with MW. Algis Downsdvised pt there is no sooner openings (consult appt) at this time but she can call back to see if there are cancellations. Pt in no acute distress at this time. Advised pt because we have not seen her in a while, if she feels she needs to be seen then she will need to contact her PCP. Pt verbalized understanding and denied any further questions or concerns at this time.

## 2014-11-12 ENCOUNTER — Ambulatory Visit (INDEPENDENT_AMBULATORY_CARE_PROVIDER_SITE_OTHER): Payer: 59 | Admitting: Internal Medicine

## 2014-11-12 ENCOUNTER — Encounter: Payer: Self-pay | Admitting: Internal Medicine

## 2014-11-12 VITALS — BP 132/70 | HR 71 | Ht 65.0 in | Wt 177.4 lb

## 2014-11-12 DIAGNOSIS — J45991 Cough variant asthma: Secondary | ICD-10-CM | POA: Diagnosis not present

## 2014-11-12 MED ORDER — MOMETASONE FURO-FORMOTEROL FUM 100-5 MCG/ACT IN AERO: INHALATION_SPRAY | RESPIRATORY_TRACT | Status: DC

## 2014-11-12 NOTE — Progress Notes (Signed)
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 Pulmonary Consult    Self referral. Pt states dxed with Asthma at age 54. She c/o increased SOB, wheezing and cough x 4 wks. Cough is prod with moderate clear sputum. Exertion makes her symptoms worse.   cough comes and goes x 9 months,  most recent x 4 weeks "worst ever" cough to point of vomit but previously clearly also caused reflux symptoms  Cough is more daytime than noct, more often dry than wet.  Mostly sob when coughing   No obvious other patterns in day to day or daytime variabilty or assoc   cp or chest tightness, subjective wheeze or overt sinus   symptoms. No unusual exp hx or h/o childhood pna/ asthma or knowledge of premature birth.  Sleeping ok without nocturnal  or early am exacerbation  of respiratory  c/o's or need for noct saba. Also denies any obvious fluctuation of symptoms with weather or environmental changes or other aggravating or alleviating factors except as outlined above   Current Medications, Allergies, Complete Past Medical History, Past Surgical History, Family History, and Social History were [facility].             Review of Systems  Constitutional: Negative for fever, chills and unexpected weight change.  HENT: Negative for congestion, dental problem, ear pain, nosebleeds, postnasal drip, rhinorrhea, sinus pressure, sneezing, sore throat, trouble swallowing and voice change.   Eyes: Negative for visual  disturbance.  Respiratory: Positive for cough and shortness of breath. Negative for choking.   Cardiovascular: Negative for chest pain and leg swelling.  Gastrointestinal: Negative for vomiting, abdominal pain and diarrhea.  Genitourinary: Negative for difficulty urinating.  Musculoskeletal: Negative for arthralgias.  Skin: Negative for rash.  Neurological: Negative for tremors, syncope and headaches.  Hematological: Does not bruise/bleed easily.       Objective:   Physical Exam   amb wf nad   Wt Readings from Last 3 Encounters:  11/12/14 177 lb 6.4 oz (80.468 kg)  09/28/14 168 lb (76.204 kg)  02/02/13 128 lb (58.06 kg)    Vital signs reviewed    HEENT: nl dentition, turbinates, and orophanx. Nl external ear canals without cough reflex   NECK :  without JVD/Nodes/TM/ nl carotid upstrokes bilaterally   LUNGS: no acc muscle use, clear to A and P bilaterally without cough on insp or exp maneuvers   CV:  RRR  no s3 or murmur or increase in P2, no edema   ABD:  soft and nontender with nl excursion in the supine position. No bruits or organomegaly, bowel sounds nl  MS:  warm without deformities, calf tenderness, cyanosis or clubbing  SKIN: warm and dry without lesions    NEURO:  alert, approp, no deficits      I personally reviewed images and agree with radiology impression as follows:  CXR:  09/28/14 1. No acute findings. 2. Old right  rib fractures. 3. Bony bridging between the left first and second rib accounts for the vague density in this vicinity.      Assessment & Plan:

## 2014-11-12 NOTE — Patient Instructions (Addendum)
Take Pantoprazole 40 mg Take 30- 60 min before your first and last meals of the day until cough gone for at least a week   GERD (REFLUX)  is an extremely common cause of respiratory symptoms just like yours , many times with no obvious heartburn at all.    It can be treated with medication, but also with lifestyle changes including avoidance of late meals, excessive alcohol, smoking cessation, and avoid fatty foods, chocolate, peppermint, colas, red wine, and acidic juices such as orange juice.  NO MINT OR MENTHOL PRODUCTS SO NO COUGH DROPS  USE SUGARLESS CANDY INSTEAD (Jolley ranchers or Stover's or Life Savers) or even ice chips will also do - the key is to swallow to prevent all throat clearing. NO OIL BASED VITAMINS - use powdered substitutes.    Start dulera 100 Take 2 puffs first thing in am and then another 2 puffs about 12 hours later > return if not 100% happy   Only use your albuterol (ventolin)  as a rescue medication to be used if you can't catch your breath by resting or doing a relaxed purse lip breathing pattern.  - The less you use it, the better it will work when you need it. - Ok to use up to 2 puffs  every 4 hours if you must but call for immediate appointment if use goes up over your usual need - Don't leave home without it !!  (think of it like the spare tire for your car)

## 2014-11-13 ENCOUNTER — Encounter: Payer: Self-pay | Admitting: Internal Medicine

## 2014-11-13 NOTE — Assessment & Plan Note (Addendum)
Clearly poorly controlled asthma/ cough with cyclical features  DDX of  difficult airways management all start with A and  include Adherence, Ace Inhibitors, Acid Reflux, Active Sinus Disease, Alpha 1 Antitripsin deficiency, Anxiety masquerading as Airways dz,  ABPA,  allergy(esp in young), Aspiration (esp in elderly), Adverse effects of DPI,  Active smokers, plus two Bs  = Bronchiectasis and Beta blocker use..and one C= CHF   Adherence is always the initial "prime suspect" and is a multilayered concern that requires a "trust but verify" approach in every patient - starting with knowing how to use medications, especially inhalers, correctly, keeping up with refills and understanding the fundamental difference between maintenance and prns vs those medications only taken for a very short course and then stopped and not refilled.  The proper method of use, as well as anticipated side effects, of a metered-dose inhaler are discussed and demonstrated to the patient. Improved effectiveness after extensive coaching during this visit to a level of approximately  75% so try dulera 100 2bid  ? Acid (or non-acid) GERD > always difficult to exclude as up to 75% of pts in some series report no assoc GI/ Heartburn symptoms and she clearly has seoncary gerd from coughing > rec max (24h)  acid suppression and diet restrictions/ reviewed and instructions given in writing.   ? Adverse effects of dpi, esp advair > try off.   ? Allergies/ exposed to animals a lot > consider w/u next ov   Each maintenance medication was reviewed in detail including most importantly the difference between maintenance and as needed and under what circumstances the prns are to be used.  Please see instructions for details which were reviewed in writing and the patient given a copy.

## 2014-12-06 ENCOUNTER — Ambulatory Visit (HOSPITAL_COMMUNITY): Payer: 59

## 2014-12-18 ENCOUNTER — Ambulatory Visit (HOSPITAL_COMMUNITY)
Admission: RE | Admit: 2014-12-18 | Discharge: 2014-12-18 | Disposition: A | Discharge status: Home / Self Care | Payer: 59 | Source: Ambulatory Visit | Attending: Obstetrics and Gynecology | Admitting: Obstetrics and Gynecology

## 2014-12-18 DIAGNOSIS — Z1231 Encounter for screening mammogram for malignant neoplasm of breast: Secondary | ICD-10-CM | POA: Diagnosis not present

## 2014-12-23 ENCOUNTER — Other Ambulatory Visit: Payer: Self-pay | Admitting: Obstetrics and Gynecology

## 2014-12-23 DIAGNOSIS — R928 Other abnormal and inconclusive findings on diagnostic imaging of breast: Secondary | ICD-10-CM

## 2014-12-25 ENCOUNTER — Other Ambulatory Visit: Payer: Self-pay

## 2014-12-25 ENCOUNTER — Other Ambulatory Visit: Payer: Self-pay | Admitting: Obstetrics and Gynecology

## 2014-12-25 DIAGNOSIS — R928 Other abnormal and inconclusive findings on diagnostic imaging of breast: Secondary | ICD-10-CM

## 2015-01-30 ENCOUNTER — Ambulatory Visit
Admission: RE | Admit: 2015-01-30 | Discharge: 2015-01-30 | Disposition: A | Discharge status: Home / Self Care | Payer: 59 | Source: Ambulatory Visit | Attending: Obstetrics and Gynecology | Admitting: Obstetrics and Gynecology

## 2015-01-30 DIAGNOSIS — R928 Other abnormal and inconclusive findings on diagnostic imaging of breast: Secondary | ICD-10-CM

## 2015-02-26 ENCOUNTER — Ambulatory Visit (INDEPENDENT_AMBULATORY_CARE_PROVIDER_SITE_OTHER): Payer: 59 | Admitting: Internal Medicine

## 2015-02-26 ENCOUNTER — Encounter: Payer: Self-pay | Admitting: Internal Medicine

## 2015-02-26 VITALS — BP 104/84 | HR 85 | Ht 65.5 in | Wt 165.7 lb

## 2015-02-26 DIAGNOSIS — J45991 Cough variant asthma: Secondary | ICD-10-CM | POA: Diagnosis not present

## 2015-02-26 MED ORDER — MOMETASONE FURO-FORMOTEROL FUM 200-5 MCG/ACT IN AERO: INHALATION_SPRAY | RESPIRATORY_TRACT | Status: DC

## 2015-02-26 NOTE — Progress Notes (Signed)
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 Pulmonary Consult    Self referral. Pt states dxed with Asthma at age 45. She c/o increased SOB, wheezing and cough x 4 wks. Cough is prod with moderate clear sputum. Exertion makes her symptoms worse.   cough comes and goes x 9 months,  most recent x 4 weeks "worst ever" cough to point of vomit but previously clearly also caused reflux symptoms  Cough is more daytime than noct, more often dry than wet.  Mostly sob when coughing  rec Take Pantoprazole 40 mg Take 30- 60 min before your first and last meals of the day until cough gone for at least a week  GERD diet  Start dulera 100 Take 2 puffs first thing in am and then another 2 puffs about 12 hours later > return if not 100% happy  Only use your albuterol (ventolin) as rescue     02/26/2015 f/u ov/Shelley Pearson re: ? Cough variant asthma  Chief Complaint  Patient presents with  . Follow-up    Pt states that her cough is unchanged since the last visit. She always feels congested in the am and has wheezing during the day. She has been coughing up min white to clear sputum.   no premature awakening / doesn't take dulera until sev hours after rising  Sensation of chest congestion/wheezing daytime but able to exercise ok   No obvious day to day or daytime variability or assoc  cp or chest tightness, subjective wheeze or overt sinus or hb symptoms. No unusual exp hx or h/o childhood pna/  asthma or knowledge of premature birth.  Sleeping ok without nocturnal  or early am exacerbation  of respiratory  c/o's or need for noct saba. Also denies any obvious fluctuation of symptoms with weather or environmental changes or other aggravating or alleviating factors except as outlined above   Current Medications, Allergies, Complete Past Medical History, Past Surgical History, Family History, and Social History were reviewed in [facility] record.  ROS  The following are not active complaints unless bolded sore throat, dysphagia, dental problems, itching, sneezing,  nasal congestion or excess/ purulent secretions, ear ache,   fever, chills, sweats, unintended wt loss, classically pleuritic or exertional cp, hemoptysis,  orthopnea pnd or leg swelling, presyncope, palpitations, abdominal pain, anorexia, nausea, vomiting, diarrhea  or change in bowel or bladder habits, change in stools or urine, dysuria,hematuria,  rash, arthralgias, visual complaints, headache, numbness, weakness or ataxia or problems with walking or coordination,  change in mood/affect or memory.                Objective:   Physical Exam   amb wf nad   02/26/2015       166  Wt Readings from Last 3 Encounters:  11/12/14 177 lb 6.4 oz (80.468 kg)  09/28/14 168 lb (76.204 kg)  02/02/13 128  lb (58.06 kg)    Vital signs reviewed    HEENT: nl dentition, turbinates, and orophanx. Nl external ear canals without cough reflex   NECK :  without JVD/Nodes/TM/ nl carotid upstrokes bilaterally   LUNGS: no acc muscle use, clear to A and P bilaterally without cough on insp or exp maneuvers   CV:  RRR  no s3 or murmur or increase in P2, no edema   ABD:  soft and nontender with nl excursion in the supine position. No bruits or organomegaly, bowel sounds nl  MS:  warm without deformities, calf tenderness, cyanosis or clubbing  SKIN: warm and dry without lesions    NEURO:  alert, approp, no  deficits      I personally reviewed images and agree with radiology impression as follows:  CXR:  09/28/14 1. No acute findings. 2. Old right rib fractures. 3. Bony bridging between the left first and second rib accounts for the vague density in this vicinity.      Assessment & Plan:

## 2015-02-26 NOTE — Patient Instructions (Addendum)
For drainage take chlortrimeton (chlorpheniramine) 4 mg every 4 hours available over the counter (may cause drowsiness)   Increase dulera to 200 Take 2 puffs first thing in am and then another 2 puffs about 12 hours later.   Prednisone 10 mg take  4 each am x 2 days,   2 each am x 2 days,  1 each am x 2 days and stop   GERD (REFLUX)  is an extremely common cause of respiratory symptoms just like yours , many times with no obvious heartburn at all.    It can be treated with medication, but also with lifestyle changes including elevation of the head of your bed (ideally with 6 inch  bed blocks),  Smoking cessation, avoidance of late meals, excessive alcohol, and avoid fatty foods, chocolate, peppermint, colas, red wine, and acidic juices such as orange juice.  NO MINT OR MENTHOL PRODUCTS SO NO COUGH DROPS  USE SUGARLESS CANDY INSTEAD (Jolley ranchers or Stover's or Life Savers) or even ice chips will also do - the key is to swallow to prevent all throat clearing. NO OIL BASED VITAMINS - use powdered substitutes.   Please schedule a follow up office visit in 2 weeks, sooner if needed and bring all meds with you  Late add if still coughing needs chest x-ray sinus scan and allergy profile

## 2015-02-27 NOTE — Assessment & Plan Note (Addendum)
-   11/12/2014    dulera 100 2bid  -02/26/2015 p extensive coaching HFA effectiveness =    90% > try dulera 200 2bid   DDX of  difficult airways management all start with A and  include Adherence, Ace Inhibitors, Acid Reflux, Active Sinus Disease, Alpha 1 Antitripsin deficiency, Anxiety masquerading as Airways dz,  ABPA,  allergy(esp in young), Aspiration (esp in elderly), Adverse effects of meds,  Active smokers, A bunch of PE's (a small clot burden can't cause this syndrome unless there is already severe underlying pulm or vascular dz with poor reserve) plus two Bs  = Bronchiectasis and Beta blocker use..and one C= CHF  Adherence is always the initial "prime suspect" and is a multilayered concern that requires a "trust but verify" approach in every patient - starting with knowing how to use medications, especially inhalers, correctly, keeping up with refills and understanding the fundamental difference between maintenance and prns vs those medications only taken for a very short course and then stopped and not refilled.  - does not know names of meds > bring them back at next ov  ? Acid (or non-acid) GERD > always difficult to exclude as up to 75% of pts in some series report no assoc GI/ Heartburn symptoms> rec continue max (24h)  acid suppression and diet restrictions/ reviewed     ? Allergy > double dose of ICS and if not better > Prednisone 10 mg take  4 each am x 2 days,   2 each am x 2 days,  1 each am x 2 days and stop   ? Active sinus dz/ pnds > add 1st gen h1 per guidelines.  I had an extended discussion with the patient reviewing all relevant studies completed to date and  lasting 15 to 20 minutes of a 25 minute visit    Each maintenance medication was reviewed in detail including most importantly the difference between maintenance and prns and under what circumstances the prns are to be triggered using an action plan format that is not reflected in the computer generated alphabetically  organized AVS.    Please see instructions for details which were reviewed in writing and the patient given a copy highlighting the part that I personally wrote and discussed at today's ov.

## 2015-03-12 ENCOUNTER — Ambulatory Visit: Payer: 59 | Admitting: Internal Medicine

## 2015-03-24 ENCOUNTER — Encounter: Payer: Self-pay | Admitting: Podiatry

## 2015-03-24 ENCOUNTER — Ambulatory Visit (INDEPENDENT_AMBULATORY_CARE_PROVIDER_SITE_OTHER): Payer: 59 | Admitting: Podiatry

## 2015-03-24 ENCOUNTER — Ambulatory Visit (INDEPENDENT_AMBULATORY_CARE_PROVIDER_SITE_OTHER): Payer: 59

## 2015-03-24 VITALS — BP 120/69 | HR 58 | Resp 16 | Ht 65.5 in | Wt 162.0 lb

## 2015-03-24 DIAGNOSIS — M722 Plantar fascial fibromatosis: Secondary | ICD-10-CM | POA: Diagnosis not present

## 2015-03-24 MED ORDER — TRIAMCINOLONE ACETONIDE 10 MG/ML IJ SUSP
10.0000 mg | Freq: Once | INTRAMUSCULAR | Status: AC
  Administered 2015-03-24: 10 mg

## 2015-03-24 MED ORDER — DICLOFENAC SODIUM 75 MG PO TBEC: 75.0000 mg | DELAYED_RELEASE_TABLET | Freq: Two times a day (BID) | ORAL | Status: DC

## 2015-03-24 NOTE — Patient Instructions (Signed)

## 2015-03-24 NOTE — Progress Notes (Signed)
   Subjective:    Patient ID: Shelley Pearson, female    DOB: May 04, 1961, 54 y.o.   MRN: [id number]  HPI Patient presents with bilateral foot pain, heel and arch. Pt stated, "pain radiates to back of heel". This has been going on for the past 6 months. Soaked feet in epsom salt with no relief. Iced foot with some relief.  Pt did see Dr. Andree Coss from [facility] in 2014. Pt was diagnosed with Plantar Fasciitis. Did have a cortisone shot with relief. (had a total of 3)  Pt does have a boot and has orthotics.   Review of Systems  All other systems reviewed and are negative.      Objective:   Physical Exam        Assessment & Plan:

## 2015-03-25 NOTE — Progress Notes (Signed)
Subjective:     Patient ID: Shelley Pearson, female   DOB: 06-Mar-1961, 54 y.o.   MRN: [id number]  HPI patient presents with several year history of heel pain. She did see another doctor in 2014 had a cortisone injection and had a temporary type orthotic may but did not have permanent devices made which she knows that she needs. The pain has intensified over the last 6 months   Review of Systems  All other systems reviewed and are negative.      Objective:   Physical Exam  Constitutional: She is oriented to person, place, and time.  Cardiovascular: Intact distal pulses.   Musculoskeletal: Normal range of motion.  Neurological: She is oriented to person, place, and time.  Skin: Skin is warm.  Nursing note and vitals reviewed.  neurovascular status intact muscle strength adequate range of motion within normal limits with patient having exquisite discomfort in the plantar aspects of both heel at the insertion of the tendon into the calcaneus. Fluid buildup is noted and moderate depression of the arch is noted upon weightbearing with no indication of hallux limitus condition     Assessment:     Acute plantar fasciitis bilateral with mechanical dysfunction noted    Plan:     H&P and x-rays reviewed with patient. Injected the plantar fascia bilateral 3 mg Kenalog 5 mg Xylocaine and applied fascial brace bilateral and scanned for custom orthotics to reduce plantar stresses. Reappoint when orthotics are returned

## 2015-03-26 ENCOUNTER — Telehealth: Payer: Self-pay | Admitting: Internal Medicine

## 2015-03-26 MED ORDER — MOMETASONE FURO-FORMOTEROL FUM 200-5 MCG/ACT IN AERO: INHALATION_SPRAY | RESPIRATORY_TRACT | Status: DC

## 2015-03-26 NOTE — Telephone Encounter (Signed)
Rx sent to pharmacy. Pharmacy notified. Nothing further needed.

## 2015-04-14 ENCOUNTER — Ambulatory Visit: Payer: 59 | Admitting: Podiatry

## 2015-04-24 ENCOUNTER — Ambulatory Visit (INDEPENDENT_AMBULATORY_CARE_PROVIDER_SITE_OTHER): Payer: BLUE CROSS/BLUE SHIELD | Admitting: Podiatry

## 2015-04-24 ENCOUNTER — Encounter: Payer: Self-pay | Admitting: Podiatry

## 2015-04-24 DIAGNOSIS — M722 Plantar fascial fibromatosis: Secondary | ICD-10-CM | POA: Diagnosis not present

## 2015-04-24 MED ORDER — TRIAMCINOLONE ACETONIDE 10 MG/ML IJ SUSP
10.0000 mg | Freq: Once | INTRAMUSCULAR | Status: AC
  Administered 2015-04-24: 10 mg

## 2015-04-24 NOTE — Patient Instructions (Signed)

## 2015-04-25 NOTE — Progress Notes (Signed)
Subjective:     Patient ID: Shelley Pearson, female   DOB: [birth year], 54 y.o.   MRN: [id number]  HPI patient states my heels are still bother me quite a bit in my heel is very tender when I get up in the morning or after periods of sitting   Review of Systems     Objective:   Physical Exam Neurovascular status intact muscle strength adequate range of motion within normal limits with discomfort in the plantar heel bilateral with inflammation and fluid around the medial band at the insertion to the calcaneus    Assessment:     Plantar fasciitis bilateral with inflammation and fluid buildup    Plan:     Reviewed condition and reinjected the plantar fascia bilateral 3 mg Kenalog 5 mg Xylocaine and dispensed a night splint with all instructions on usage. Reappoint to recheck again in 4 weeks and did dispense orthotics today

## 2015-05-26 ENCOUNTER — Ambulatory Visit (INDEPENDENT_AMBULATORY_CARE_PROVIDER_SITE_OTHER): Payer: BLUE CROSS/BLUE SHIELD | Admitting: Podiatry

## 2015-05-26 ENCOUNTER — Encounter: Payer: Self-pay | Admitting: Podiatry

## 2015-05-26 VITALS — BP 117/77 | HR 62 | Resp 16

## 2015-05-26 DIAGNOSIS — M722 Plantar fascial fibromatosis: Secondary | ICD-10-CM

## 2015-05-27 NOTE — Progress Notes (Signed)
Subjective:     Patient ID: Shelley Pearson, female   DOB: 17-Dec-1960, 54 y.o.   MRN: [id number]  HPI patient states I'm improved but still feeling some discomfort when palpated   Review of Systems     Objective:   Physical Exam Neurovascular status intact muscle strength adequate range of motion within normal limits with pain still noted dorsal left over right foot with tendinitis-like symptoms with no indication of muscle strength loss or circulatory issues    Assessment:     Tendinitis improving but still present    Plan:     Advised on physical therapy and anti-inflammatories and supportive shoe gear usage and gradual reduction of boot usage and reappoint as needed

## 2015-08-20 DIAGNOSIS — E785 Hyperlipidemia, unspecified: Secondary | ICD-10-CM | POA: Diagnosis not present

## 2016-02-09 ENCOUNTER — Other Ambulatory Visit: Payer: Self-pay | Admitting: Obstetrics and Gynecology

## 2016-02-09 DIAGNOSIS — Z1231 Encounter for screening mammogram for malignant neoplasm of breast: Secondary | ICD-10-CM

## 2016-03-04 ENCOUNTER — Ambulatory Visit: Payer: 59

## 2016-03-09 ENCOUNTER — Ambulatory Visit: Payer: 59

## 2016-05-12 ENCOUNTER — Ambulatory Visit: Payer: BLUE CROSS/BLUE SHIELD

## 2016-05-12 ENCOUNTER — Encounter: Payer: Self-pay | Admitting: Podiatry

## 2016-05-12 ENCOUNTER — Ambulatory Visit (INDEPENDENT_AMBULATORY_CARE_PROVIDER_SITE_OTHER): Payer: BLUE CROSS/BLUE SHIELD | Admitting: Podiatry

## 2016-05-12 ENCOUNTER — Ambulatory Visit (INDEPENDENT_AMBULATORY_CARE_PROVIDER_SITE_OTHER): Payer: BLUE CROSS/BLUE SHIELD

## 2016-05-12 VITALS — BP 98/62 | HR 70 | Resp 16

## 2016-05-12 DIAGNOSIS — M79671 Pain in right foot: Secondary | ICD-10-CM

## 2016-05-12 DIAGNOSIS — M79674 Pain in right toe(s): Secondary | ICD-10-CM | POA: Diagnosis not present

## 2016-05-12 DIAGNOSIS — M722 Plantar fascial fibromatosis: Secondary | ICD-10-CM

## 2016-05-12 DIAGNOSIS — M79672 Pain in left foot: Secondary | ICD-10-CM

## 2016-05-12 MED ORDER — TRIAMCINOLONE ACETONIDE 10 MG/ML IJ SUSP
10.0000 mg | Freq: Once | INTRAMUSCULAR | Status: AC
  Administered 2016-05-12: 10 mg

## 2016-05-13 ENCOUNTER — Ambulatory Visit: Payer: BLUE CROSS/BLUE SHIELD | Admitting: Podiatry

## 2016-05-13 NOTE — Progress Notes (Signed)
Subjective:     Patient ID: Dawayne PatriciaKristin M Kokesh, female   DOB: Jul 22, 1960, 55 y.o.   MRN: [id number]  HPI patient presents stating that she has had heel pain for a number of years and it has been worse over the last approximate 4-6 months. States that it does affect activities even though she tries to push through it but it has gotten worse gradually over the last couple years   Review of Systems     Objective:   Physical Exam Neurovascular status intact muscle strength adequate with discomfort in the plantar fascial bilateral at the insertional point tendon into the calcaneus with inflammation fluid medial band right over left    Assessment:     Continued chronic plantar fasciitis bilateral with acute manifestation    Plan:     H&P conditions reviewed and today I did go ahead reinjected the plantar fascia 3 mg Kenalog 5 mg Xylocaine and did scanned for a Berkley type orthotic to reduce plantar stresses. I then discussed shockwave therapy which I do think it may be necessary in educated her on this and her mom is being tested for a less and after we get the diagnosis we will decide what would be best for her long-term  X-rays indicate spur formation with no indications of stress fracture arthritis

## 2016-05-31 ENCOUNTER — Ambulatory Visit: Payer: BLUE CROSS/BLUE SHIELD | Admitting: Podiatry

## 2016-06-02 ENCOUNTER — Ambulatory Visit (INDEPENDENT_AMBULATORY_CARE_PROVIDER_SITE_OTHER): Payer: BLUE CROSS/BLUE SHIELD | Admitting: Podiatry

## 2016-06-02 ENCOUNTER — Encounter: Payer: Self-pay | Admitting: Podiatry

## 2016-06-02 DIAGNOSIS — M722 Plantar fascial fibromatosis: Secondary | ICD-10-CM

## 2016-06-02 MED ORDER — TRIAMCINOLONE ACETONIDE 10 MG/ML IJ SUSP
10.0000 mg | Freq: Once | INTRAMUSCULAR | Status: AC
  Administered 2016-06-02: 10 mg

## 2016-06-02 NOTE — Patient Instructions (Signed)

## 2016-06-03 NOTE — Progress Notes (Signed)
Subjective:     Patient ID: Shelley Pearson PatriciaKristin M Craigo, female   DOB: 07/26/1960, 55 y.o.   MRN: [id number]  HPI patient states I still have one spot that really bothering me with mild to moderate improvement   Review of Systems     Objective:   Physical Exam Neurovascular status intact with discomfort plantar aspect right heel at the insertional point tendon into the calcaneus    Assessment:     Plantar fasciitis right with inflammation fluid buildup    Plan:     H&P condition reviewed and recommended 1 more injection and explained risk and today I infiltrated the right heel with 3 mg Kenalog and excellent some 5 g Xylocaine advised on reduced activity and reappoint to recheck and begin wearing orthotics at this time

## 2016-06-30 ENCOUNTER — Ambulatory Visit: Payer: BLUE CROSS/BLUE SHIELD | Admitting: Podiatry

## 2016-07-07 ENCOUNTER — Ambulatory Visit: Payer: BLUE CROSS/BLUE SHIELD | Admitting: Podiatry

## 2017-03-07 ENCOUNTER — Telehealth: Payer: Self-pay | Admitting: Internal Medicine

## 2017-03-07 NOTE — Telephone Encounter (Signed)
Pt requesting an appt for asthma exacerbation. Pt has not been seen since 2016.   Several open appts this week on MW's schedule. Pt scheduled on Wednesday to see MW.   Nothing further needed.

## 2017-03-09 ENCOUNTER — Ambulatory Visit (INDEPENDENT_AMBULATORY_CARE_PROVIDER_SITE_OTHER)
Admission: RE | Admit: 2017-03-09 | Discharge: 2017-03-09 | Disposition: A | Discharge status: Home / Self Care | Payer: BLUE CROSS/BLUE SHIELD | Source: Ambulatory Visit | Attending: Internal Medicine | Admitting: Internal Medicine

## 2017-03-09 ENCOUNTER — Ambulatory Visit (INDEPENDENT_AMBULATORY_CARE_PROVIDER_SITE_OTHER): Payer: BLUE CROSS/BLUE SHIELD | Admitting: Internal Medicine

## 2017-03-09 ENCOUNTER — Encounter: Payer: Self-pay | Admitting: Internal Medicine

## 2017-03-09 VITALS — BP 116/72 | HR 56 | Ht 65.0 in | Wt 167.4 lb

## 2017-03-09 DIAGNOSIS — J45991 Cough variant asthma: Secondary | ICD-10-CM

## 2017-03-09 DIAGNOSIS — R05 Cough: Secondary | ICD-10-CM | POA: Diagnosis not present

## 2017-03-09 MED ORDER — MOMETASONE FURO-FORMOTEROL FUM 100-5 MCG/ACT IN AERO: 2.0000 | INHALATION_SPRAY | Freq: Two times a day (BID) | RESPIRATORY_TRACT | 11 refills | Status: DC

## 2017-03-09 MED ORDER — MOMETASONE FURO-FORMOTEROL FUM 100-5 MCG/ACT IN AERO: 2.0000 | INHALATION_SPRAY | Freq: Two times a day (BID) | RESPIRATORY_TRACT | 0 refills | Status: DC

## 2017-03-09 NOTE — Patient Instructions (Addendum)
Prednisone 10 mg take  4 each am x 2 days,   2 each am x 2 days,  1 each am x 2 days and stop    Change advair to dulera 100 up to 2 every 12 hours   Work on inhaler technique:  relax and gently blow all the way out then take a nice smooth deep breath back in, triggering the inhaler at same time you start breathing in.  Hold for up to 5 seconds if you can. Blow out thru nose. Rinse and gargle with water when done       If still coughing >> Try prilosec otc 20mg   Take 30-60 min before first meal of the day and Pepcid ac (famotidine) 20 mg one @  bedtime until cough is completely gone for at least a week without the need for cough suppression  GERD (REFLUX)  is an extremely common cause of respiratory symptoms just like yours , many times with no obvious heartburn at all.    It can be treated with medication, but also with lifestyle changes including elevation of the head of your bed (ideally with 6 inch  bed blocks),  Smoking cessation, avoidance of late meals, excessive alcohol, and avoid fatty foods, chocolate, peppermint, colas, red wine, and acidic juices such as orange juice.  NO MINT OR MENTHOL PRODUCTS SO NO COUGH DROPS   USE SUGARLESS CANDY INSTEAD (Jolley ranchers or Stover's or Life Savers) or even ice chips will also do - the key is to swallow to prevent all throat clearing. NO OIL BASED VITAMINS - use powdered substitutes.     Please remember to go to the  x-ray department downstairs in the basement  for your tests - we will call you with the results when they are available.   If you are satisfied with your treatment plan,  let your doctor know and he/she can either refill your medications or you can return here when your prescription runs out.     If in any way you are not 100% satisfied,  please tell us.  If 100% better, tell your friends!  Pulmonary follow up is as needed

## 2017-03-09 NOTE — Progress Notes (Signed)
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 Pulmonary Consult    Self referral. Pt states dxed with Asthma at age 56. She c/o increased SOB, wheezing and cough x 4 wks. Cough is prod with moderate clear sputum. Exertion makes her symptoms worse.   cough comes and goes x 9 months,  most recent x 4 weeks "worst ever" cough to point of vomit but previously clearly also caused reflux symptoms  Cough is more daytime than noct, more often dry than wet.  Mostly sob when coughing  rec Take Pantoprazole 40 mg Take 30- 60 min before your first and last meals of the day until cough gone for at least a week  GERD diet  Start dulera 100 Take 2 puffs first thing in am and then another 2 puffs about 12 hours later > return if not 100% happy  Only use your albuterol (ventolin) as rescue      02/26/2015 f/u ov/Meridith Romick re: ? Cough variant asthma  Chief Complaint  Patient presents with  . Follow-up    Pt states that her cough is unchanged since the last visit. She always feels congested in the am and has wheezing during the day. She has been coughing up min white to clear sputum.   no premature awakening / doesn't take dulera until sev hours after rising  Sensation of chest congestion/wheezing daytime but able to exercise ok  rec For drainage take chlortrimeton (chlorpheniramine) 4 mg every 4 hours available over the counter (may cause drowsiness)  Increase dulera to  200 Take 2 puffs first thing in am and then another 2 puffs about 12 hours later.  Prednisone 10 mg take  4 each am x 2 days,   2 each am x 2 days,  1 each am x 2 days and stop  GERD  Diet    03/09/2017 acute extended ov/Jaquail Mclees re: advair 250  Chief Complaint  Patient presents with  . Acute Visit    Pt having productive cough it is thick, sticky flem yellow color. Pt states 3 weeks ago worsen since 3rd week in Aug. Pt has SOB, wheezing, tightness in chest with congestion.  onset was insidious/ gradually worsening cough x 3 weeks assoc with sob and wheeze but despite    only using the saba once a week  Some noct awkening with subjective wheeze/ mild sob /thick mucus really not purulent with ? Mucus Plugs  No obvious day to day or daytime variability or assoc   or hemoptysis or cp or chest tightness, subjective wheeze or overt sinus or hb symptoms. No unusual exp hx or h/o childhood pna/ asthma or knowledge of premature birth.  Sleeping ok flat without nocturnal  or early am exacerbation  of respiratory  c/o's or need for noct saba. Also denies any obvious fluctuation of symptoms with weather or environmental changes or other  aggravating or alleviating factors except as outlined above   Current Allergies, Complete Past Medical History, Past Surgical History, Family History, and Social History were reviewed in [facility] electronic medical record.  ROS  The following are not active complaints unless bolded sore throat, dysphagia, dental problems, itching, sneezing,  nasal congestion or disharge of excess mucus or purulent secretions, ear ache,   fever, chills, sweats, unintended wt loss or wt gain, classically pleuritic or exertional cp,  orthopnea pnd or leg swelling, presyncope, palpitations, abdominal pain, anorexia, nausea, vomiting, diarrhea  or change in bowel habits or bladder habits, change in stools or change in urine, dysuria, hematuria,  rash, arthralgias, visual complaints, headache,  numbness, weakness or ataxia or problems with walking or coordination,  change in mood/affect or memory.        Current Meds  Medication Sig  . albuterol (PROVENTIL HFA;VENTOLIN HFA) 108 (90 BASE) MCG/ACT inhaler Inhale 2 puffs into the lungs every 6 (six) hours as needed. For shortness of breath or wheezing    . escitalopram (LEXAPRO) 20 MG tablet Take 20 mg by mouth daily.    .  fluticasone-salmeterol (ADVAIR HFA) 115-21 MCG/ACT inhaler Inhale 2 puffs into the lungs 2 (two) times daily.                    Objective:   Physical Exam   amb wf nad   02/26/2015       166  > 03/09/2017  167     11/12/14 177 lb 6.4 oz (80.468 kg)  09/28/14 168 lb (76.204 kg)  02/02/13 128 lb (58.06 kg)    Vital signs reviewed  - Note on arrival 02 sats  99% on RA     HEENT: nl dentition,  and oropharynx. Nl external ear canals without cough reflex - mild to moderate bilateral non-specific turbinate edema     NECK :  without JVD/Nodes/TM/ nl carotid upstrokes bilaterally   LUNGS: no acc muscle use, insp and exp rhonchi bilaterally with upper airway component    CV:  RRR  no s3 or murmur or increase in P2, no edema   ABD:  soft and nontender with nl excursion in the supine position. No bruits or organomegaly, bowel sounds nl  MS:  warm without deformities, calf tenderness, cyanosis or clubbing  SKIN: warm and dry without lesions    NEURO:  alert, approp, no deficits     CXR PA and Lateral:   03/09/2017 :    I personally reviewed images and agree with radiology impression as follows:    No active cardiopulmonary disease.     Assessment & Plan:

## 2017-03-10 NOTE — Assessment & Plan Note (Signed)
-   03/09/2017 flare on advair 250 > change back  to dulera 100 2bid  - 03/09/2017  After extensive coaching HFA effectiveness =    90% from a baseline of 75%   Flare on advair 250 with suboptimal baseline use of hfa including inappropriate rescue (too little)  rec pred x 6 and rechallenge with approp saba use and maint on dulera 100 2bid with step up to 200 2 bid if not back to baseline and if still not better add gerd rx back empirically as worked in past   I had an extended discussion with the patient reviewing all relevant studies completed to date and  lasting 15 to 20 minutes of a 25 minute acute office  visit    Each maintenance medication was reviewed in detail including most importantly the difference between maintenance and prns and under what circumstances the prns are to be triggered using an action plan format that is not reflected in the computer generated alphabetically organized AVS.    Please see AVS for specific instructions unique to this visit that I personally wrote and verbalized to the the pt in detail and then reviewed with pt  by my nurse highlighting any  changes in therapy recommended at today's visit to their plan of care.

## 2017-03-17 ENCOUNTER — Telehealth: Payer: Self-pay | Admitting: Internal Medicine

## 2017-03-17 MED ORDER — PREDNISONE 10 MG PO TABS: ORAL_TABLET | ORAL | 0 refills | Status: DC

## 2017-03-17 NOTE — Telephone Encounter (Signed)
Called spoke with patient who reported her symptoms are no better since she saw MW on 9.12.18 >> still prod cough with sticky yellow mucus, wheezing and increased SOB.  She stated she was supposed to have prednisone but this was not at the pharmacy.    Upon inspection of patient's chart, Rx was not sent to the pharmacy.  Pt is requesting this be done. Rx sent per MW's instruction in the AVS to pt's verified pharmacy Pt is aware to contact the office if her symptoms do not improve with the prednisone or they worsen Will sign off   Per the 9.12.18 office visit: Patient Instructions  Prednisone 10 mg take  4 each am x 2 days,   2 each am x 2 days,  1 each am x 2 days and stop      Change advair to dulera 100 up to 2 every 12 hours    Work on inhaler technique:  relax and gently blow all the way out then take a nice smooth deep breath back in, triggering the inhaler at same time you start breathing in.  Hold for up to 5 seconds if you can. Blow out thru nose. Rinse and gargle with water when done        If still coughing >> Try prilosec otc   Take 30-60 min before first meal of the day and Pepcid ac (famotidine) 20 mg one @  bedtime until cough is completely gone for at least a week without the need for cough suppression   GERD (REFLUX)  is an extremely common cause of respiratory symptoms just like yours , many times with no obvious heartburn at all.     It can be treated with medication, but also with lifestyle changes including elevation of the head of your bed (ideally with 6 inch  bed blocks),  Smoking cessation, avoidance of late meals, excessive alcohol, and avoid fatty foods, chocolate, peppermint, colas, red wine, and acidic juices such as orange juice.  NO MINT OR MENTHOL PRODUCTS SO NO COUGH DROPS   USE SUGARLESS CANDY INSTEAD (Jolley ranchers or Stover's or Life Savers) or even ice chips will also do - the key is to swallow to prevent all throat clearing. NO OIL BASED VITAMINS -  use powdered substitutes.       Please remember to go to the  x-ray department downstairs in the basement  for your tests - we will call you with the results when they are available.    If you are satisfied with your treatment plan,  let your doctor know and he/she can either refill your medications or you can return here when your prescription runs out.      If in any way you are not 100% satisfied,  please tell us.  If 100% better, tell your friends!   Pulmonary follow up is as needed

## 2017-03-17 NOTE — Telephone Encounter (Signed)
lmtcb x1 for pt. 

## 2017-05-03 DIAGNOSIS — N390 Urinary tract infection, site not specified: Secondary | ICD-10-CM | POA: Diagnosis not present

## 2017-05-03 DIAGNOSIS — Z0000 Encounter for general adult medical examination without abnormal findings: Secondary | ICD-10-CM | POA: Diagnosis not present

## 2017-05-03 DIAGNOSIS — E785 Hyperlipidemia, unspecified: Secondary | ICD-10-CM | POA: Diagnosis not present

## 2017-05-05 DIAGNOSIS — Z6828 Body mass index (BMI) 28.0-28.9, adult: Secondary | ICD-10-CM | POA: Diagnosis not present

## 2017-05-05 DIAGNOSIS — S39012A Strain of muscle, fascia and tendon of lower back, initial encounter: Secondary | ICD-10-CM | POA: Diagnosis not present

## 2017-11-09 DIAGNOSIS — Z01419 Encounter for gynecological examination (general) (routine) without abnormal findings: Secondary | ICD-10-CM | POA: Diagnosis not present

## 2017-11-09 DIAGNOSIS — Z13820 Encounter for screening for osteoporosis: Secondary | ICD-10-CM | POA: Diagnosis not present

## 2017-11-09 DIAGNOSIS — Z6829 Body mass index (BMI) 29.0-29.9, adult: Secondary | ICD-10-CM | POA: Diagnosis not present

## 2017-11-16 DIAGNOSIS — W19XXXA Unspecified fall, initial encounter: Secondary | ICD-10-CM | POA: Diagnosis not present

## 2017-11-16 DIAGNOSIS — M79642 Pain in left hand: Secondary | ICD-10-CM | POA: Diagnosis not present

## 2017-11-16 DIAGNOSIS — S62002A Unspecified fracture of navicular [scaphoid] bone of left wrist, initial encounter for closed fracture: Secondary | ICD-10-CM | POA: Diagnosis not present

## 2017-11-24 DIAGNOSIS — S62022A Displaced fracture of middle third of navicular [scaphoid] bone of left wrist, initial encounter for closed fracture: Secondary | ICD-10-CM | POA: Diagnosis not present

## 2017-12-15 DIAGNOSIS — S62022A Displaced fracture of middle third of navicular [scaphoid] bone of left wrist, initial encounter for closed fracture: Secondary | ICD-10-CM | POA: Diagnosis not present

## 2017-12-15 DIAGNOSIS — R52 Pain, unspecified: Secondary | ICD-10-CM | POA: Diagnosis not present

## 2018-01-12 DIAGNOSIS — R52 Pain, unspecified: Secondary | ICD-10-CM | POA: Diagnosis not present

## 2018-01-12 DIAGNOSIS — S62022A Displaced fracture of middle third of navicular [scaphoid] bone of left wrist, initial encounter for closed fracture: Secondary | ICD-10-CM | POA: Diagnosis not present

## 2018-01-20 DIAGNOSIS — Y999 Unspecified external cause status: Secondary | ICD-10-CM | POA: Diagnosis not present

## 2018-01-20 DIAGNOSIS — X58XXXA Exposure to other specified factors, initial encounter: Secondary | ICD-10-CM | POA: Diagnosis not present

## 2018-01-20 DIAGNOSIS — S62022A Displaced fracture of middle third of navicular [scaphoid] bone of left wrist, initial encounter for closed fracture: Secondary | ICD-10-CM | POA: Diagnosis not present

## 2018-01-24 DIAGNOSIS — S62022A Displaced fracture of middle third of navicular [scaphoid] bone of left wrist, initial encounter for closed fracture: Secondary | ICD-10-CM | POA: Diagnosis not present

## 2018-03-02 DIAGNOSIS — S62022A Displaced fracture of middle third of navicular [scaphoid] bone of left wrist, initial encounter for closed fracture: Secondary | ICD-10-CM | POA: Diagnosis not present

## 2018-03-30 DIAGNOSIS — S62022A Displaced fracture of middle third of navicular [scaphoid] bone of left wrist, initial encounter for closed fracture: Secondary | ICD-10-CM | POA: Diagnosis not present

## 2018-05-11 DIAGNOSIS — S62022A Displaced fracture of middle third of navicular [scaphoid] bone of left wrist, initial encounter for closed fracture: Secondary | ICD-10-CM | POA: Diagnosis not present

## 2018-06-24 ENCOUNTER — Emergency Department (HOSPITAL_BASED_OUTPATIENT_CLINIC_OR_DEPARTMENT_OTHER)
Admission: EM | Admit: 2018-06-24 | Discharge: 2018-06-24 | Disposition: A | Discharge status: Home / Self Care | Payer: BLUE CROSS/BLUE SHIELD | Attending: Emergency Medicine | Admitting: Emergency Medicine

## 2018-06-24 ENCOUNTER — Encounter (HOSPITAL_BASED_OUTPATIENT_CLINIC_OR_DEPARTMENT_OTHER): Payer: Self-pay | Admitting: *Deleted

## 2018-06-24 ENCOUNTER — Other Ambulatory Visit: Payer: Self-pay

## 2018-06-24 ENCOUNTER — Emergency Department (HOSPITAL_BASED_OUTPATIENT_CLINIC_OR_DEPARTMENT_OTHER): Payer: BLUE CROSS/BLUE SHIELD

## 2018-06-24 DIAGNOSIS — J209 Acute bronchitis, unspecified: Secondary | ICD-10-CM | POA: Diagnosis not present

## 2018-06-24 DIAGNOSIS — Z79899 Other long term (current) drug therapy: Secondary | ICD-10-CM | POA: Diagnosis not present

## 2018-06-24 DIAGNOSIS — R05 Cough: Secondary | ICD-10-CM | POA: Diagnosis not present

## 2018-06-24 DIAGNOSIS — J45909 Unspecified asthma, uncomplicated: Secondary | ICD-10-CM | POA: Diagnosis not present

## 2018-06-24 MED ORDER — PREDNISONE 10 MG PO TABS: 40.0000 mg | ORAL_TABLET | Freq: Every day | ORAL | 0 refills | Status: DC

## 2018-06-24 MED ORDER — PREDNISONE 20 MG PO TABS
40.0000 mg | ORAL_TABLET | Freq: Once | ORAL | Status: AC
  Administered 2018-06-24: 40 mg via ORAL
  Filled 2018-06-24: qty 2

## 2018-06-24 MED ORDER — BENZONATATE 100 MG PO CAPS: 100.0000 mg | ORAL_CAPSULE | Freq: Three times a day (TID) | ORAL | 0 refills | Status: DC

## 2018-06-24 MED ORDER — IPRATROPIUM-ALBUTEROL 0.5-2.5 (3) MG/3ML IN SOLN
3.0000 mL | Freq: Once | RESPIRATORY_TRACT | Status: AC
  Administered 2018-06-24: 3 mL via RESPIRATORY_TRACT
  Filled 2018-06-24: qty 3

## 2018-06-24 NOTE — ED Provider Notes (Signed)
[facility] EMERGENCY DEPARTMENT Provider Note   CSN: [id number] Arrival date & time: 06/24/18  1347     History   Chief Complaint Chief Complaint  Patient presents with  . Cough    HPI Shelley Pearson is a 57 y.o. female.  The history is provided by the patient. No language interpreter was used.  Cough    Shelley PatriciaKristin M Shelley Pearson is a 57 y.o. female who presents to the Emergency Department complaining of cough. Has a history of asthma and takes dulera as well as PRN albuterol. She had a URI at the beginning of December, from which she recovered. Two days ago she developed recurrent URI symptoms with nasal congestion, cough. Now she is having associated wheezing and feels like her cold has settled in her chest. She denies any fevers. Her cough is productive of green sputum. She has used her albuterol inhaler twice with partial improvement in her symptoms. She has associated chest discomfort with coughing. She denies any abdominal pain, vomiting, diarrhea, leg swelling or pain. No history of blood clots or heart failure.  Sxs are moderate, constant, worsening.   Past Medical History:  Diagnosis Date  . Asthma   . History of recurrent UTIs     Patient Active Problem List   Diagnosis Date Noted  . Cough variant asthma 11/12/2014  . Plantar fasciitis 12/25/2012  . Right elbow pain 12/25/2012  . Pneumothorax, right 06/28/2011  . Multiple fractures of ribs of right side 06/28/2011  . Fall with injury 06/28/2011  . Asthma 06/28/2011  . Recurrent UTI 06/28/2011  . Alcohol use 06/28/2011    Past Surgical History:  Procedure Laterality Date  . APPENDECTOMY    . BLADDER SURGERY    . ENDOMETRIAL ABLATION    . HAND SURGERY       OB History   No obstetric history on file.      Home Medications    Prior to Admission medications   Medication Sig Start Date End Date Taking? Authorizing Provider  albuterol (PROVENTIL HFA;VENTOLIN HFA) 108 (90 BASE) MCG/ACT inhaler Inhale  2 puffs into the lungs every 6 (six) hours as needed. For shortness of breath or wheezing     Yes [provider]  escitalopram (LEXAPRO) 20 MG tablet Take 20 mg by mouth daily.     Yes [provider]  mometasone-formoterol (DULERA) 100-5 MCG/ACT AERO Inhale 2 puffs into the lungs 2 (two) times daily. 03/09/17  Yes Nyoka CowdenWert, Michael B, MD  benzonatate (TESSALON) 100 MG capsule Take 1 capsule (100 mg total) by mouth every 8 (eight) hours. 06/24/18   Tilden Fossaees, Netta Fodge, MD  predniSONE (DELTASONE) 10 MG tablet Take 4 tablets (40 mg total) by mouth daily. 06/24/18   Tilden Fossaees, Kelsie Kramp, MD    Family History Family History  Problem Relation Age of Onset  . Diabetes Mother   . Hyperlipidemia Mother   . Hypertension Mother   . Asthma Mother   . Skin cancer Mother   . Prostate cancer Father   . Skin cancer Father   . Heart attack Neg Hx   . Sudden death Neg Hx     Social History Social History   Tobacco Use  . Smoking status: Never Smoker  . Smokeless tobacco: Never Used  Substance Use Topics  . Alcohol use: No    Alcohol/week: 0.0 standard drinks  . Drug use: No     Allergies   Suprax [cefixime]   Review of Systems Review of Systems  Respiratory:  Positive for cough.   All other systems reviewed and are negative.    Physical Exam Updated Vital Signs BP (!) 119/51 (BP Location: Right Arm)   Pulse 81   Temp 98.5 F (36.9 C) (Oral)   Resp 18   Ht 5\' 5"  (1.651 m)   Wt 77.1 kg   LMP 04/22/2011   SpO2 97%   BMI 28.29 kg/m   Physical Exam Vitals signs and nursing note reviewed.  Constitutional:      Appearance: She is well-developed.  HENT:     Head: Normocephalic and atraumatic.  Cardiovascular:     Rate and Rhythm: Normal rate and regular rhythm.     Heart sounds: No murmur.  Pulmonary:     Effort: Pulmonary effort is normal. No respiratory distress.     Comments: Occasional rhonchi in right lung fields.  Occasional dry cough.   Abdominal:      Palpations: Abdomen is soft.     Tenderness: There is no abdominal tenderness. There is no guarding or rebound.  Musculoskeletal:        General: No swelling or tenderness.  Skin:    General: Skin is warm and dry.  Neurological:     Mental Status: She is alert and oriented to person, place, and time.  Psychiatric:        Mood and Affect: Mood normal.        Behavior: Behavior normal.      ED Treatments / Results  Labs (all labs ordered are listed, but only abnormal results are displayed) Labs Reviewed - No data to display  EKG None  Radiology Dg Chest 2 View  Result Date: 06/24/2018 CLINICAL DATA:  Productive cough. EXAM: CHEST - 2 VIEW COMPARISON:  Radiographs of March 09, 2017. FINDINGS: The heart size and mediastinal contours are within normal limits. Both lungs are clear. The visualized skeletal structures are unremarkable. IMPRESSION: No active cardiopulmonary disease. Electronically Signed   By: Lupita RaiderJames  Green Jr, M.D.   On: 06/24/2018 14:20    Procedures Procedures (including critical care time)  Medications Ordered in ED Medications  predniSONE (DELTASONE) tablet 40 mg (40 mg Oral Given 06/24/18 1648)  ipratropium-albuterol (DUONEB) 0.5-2.5 (3) MG/3ML nebulizer solution 3 mL (3 mLs Nebulization Given 06/24/18 1651)     Initial Impression / Assessment and Plan / ED Course  I have reviewed the triage vital signs and the nursing notes.  Pertinent labs & imaging results that were available during my care of the patient were reviewed by me and considered in my medical decision making (see chart for details).     She with history of asthma here for evaluation of cough, shortness of breath. She does have wheezing/rhonchi on examination without respiratory distress. No evidence of pneumonia. Following treatment with albuterol she is feeling improved with clear lung sounds on repeat assessment. Plan to treat with short course of steroids, albuterol MDI PRN at home.  Counseled patient on home care, outpatient follow-up and return precautions.  Current presentation is not consistent with pneumonia, CHF, PE.  Final Clinical Impressions(s) / ED Diagnoses   Final diagnoses:  Acute bronchitis, unspecified organism    ED Discharge Orders         Ordered    predniSONE (DELTASONE) 10 MG tablet  Daily     06/24/18 1646    benzonatate (TESSALON) 100 MG capsule  Every 8 hours     06/24/18 1646           Tilden Fossaees, Isiac Breighner, MD  06/24/18 1733  

## 2018-06-24 NOTE — ED Notes (Signed)
RT at patients side to asset patient

## 2018-06-24 NOTE — ED Triage Notes (Signed)
Cough with green sputum x 2 days.

## 2018-09-15 ENCOUNTER — Telehealth: Payer: Self-pay | Admitting: Internal Medicine

## 2018-09-15 NOTE — Telephone Encounter (Signed)
Tried to return patient's call. Line was busy.

## 2018-09-15 NOTE — Telephone Encounter (Signed)
Patient is returning phone call. Patient phone number is [phone]. ?

## 2018-09-15 NOTE — Telephone Encounter (Signed)
Will route to [place]

## 2018-09-15 NOTE — Telephone Encounter (Signed)
Patient states she wants depo shot today in office only. However, patient lives farther away than when we close. It was suggested to let NP call her something in and we could make appt with Dr. Sherene Sires on Monday. Suggested patient to go to ED but due to cost of shot she would not like to do this at this time.   Shelley Pearson please advise.

## 2018-09-15 NOTE — Telephone Encounter (Signed)
ATC again line still busy 

## 2018-09-15 NOTE — Telephone Encounter (Signed)
I tried to call patient again and her line is out of service/busy signal. Please advise her that the office is now closed and she needs to go to urgent care or we can try to do a televisit on Monday.

## 2018-09-15 NOTE — Telephone Encounter (Signed)
ATC x 2 line busy  

## 2018-09-15 NOTE — Telephone Encounter (Signed)
Tried to call patient myself and the line was busy. I was unable to reach and unable to leave a message.

## 2018-09-16 ENCOUNTER — Telehealth: Payer: Self-pay | Admitting: Pulmonary Disease

## 2018-09-16 NOTE — Telephone Encounter (Signed)
Patient called answering service  Verified prescription for Tessalon Perles and steroids to [facility] pharmacy

## 2018-09-18 ENCOUNTER — Telehealth: Payer: Self-pay | Admitting: Internal Medicine

## 2018-09-18 NOTE — Telephone Encounter (Signed)
Tried to call patient to return call regarding TN recommendations below, VM full cannot leave message. X1

## 2018-09-18 NOTE — Telephone Encounter (Signed)
Called [facility] pharmacy. Tiffany had left for the day. There was no documentation on what was needed for this patient. No one was aware as to what was needed. They will have tiffany call back tomorrow.

## 2018-09-19 NOTE — Telephone Encounter (Signed)
Attempted to call pt x2 but each time I called, I immediately got a busy tone. I did see another encounter from 09/16/2018 from [facility] where pt had called answering service and meds were sent in for pt. Nothing further needed.

## 2018-10-10 ENCOUNTER — Telehealth: Payer: Self-pay | Admitting: Internal Medicine

## 2018-10-10 NOTE — Telephone Encounter (Signed)
Pt has not been seen in office since 03/09/17 so pt will need televisit before meds can be sent in for pt.  Attempted to call pt x2 but received a busy signal and unable to leave message. Will try to call back later.

## 2018-10-11 NOTE — Telephone Encounter (Signed)
Pt calling to check the status of her medication refill and would like a call back. She can be reached at [phone].

## 2018-10-11 NOTE — Telephone Encounter (Signed)
Attempted to call pt but immediately received a busy tone. Unable to leave message.  When pt calls back, please keep her on the phone and see if someone in triage can speak to her as her number is not one that we are able to get through to.  Pt needs to be scheduled a televisit in order to receive med refills since she has not been seen since 2018.

## 2018-10-11 NOTE — Telephone Encounter (Signed)
ATC x 2 and line busy 

## 2018-10-11 NOTE — Telephone Encounter (Signed)
ATC x3, line busy. ATC (H) number listed in chart, call could not be completed as dialed. Called (W) number listed in chart, LMTCB.

## 2018-10-12 ENCOUNTER — Telehealth: Payer: Self-pay | Admitting: Internal Medicine

## 2018-10-12 NOTE — Telephone Encounter (Signed)
Spoke w/ pt regarding appt for medication refills (LOV 03/09/2017). Pt agreed to setting up a televisit appt w/ MW for tomorrow 10/13/2018 at 3:45 PM.   Routing to MW as Lorain Childes. MW, per pt please be sure to call this number: [phone] (her daughter's phone). We've experienced difficulties reaching her original # listed in her chart. Thank you.

## 2018-10-13 ENCOUNTER — Other Ambulatory Visit: Payer: Self-pay

## 2018-10-13 ENCOUNTER — Ambulatory Visit (INDEPENDENT_AMBULATORY_CARE_PROVIDER_SITE_OTHER): Payer: BLUE CROSS/BLUE SHIELD | Admitting: Internal Medicine

## 2018-10-13 DIAGNOSIS — J45991 Cough variant asthma: Secondary | ICD-10-CM | POA: Diagnosis not present

## 2018-10-13 MED ORDER — MOMETASONE FURO-FORMOTEROL FUM 200-5 MCG/ACT IN AERO: INHALATION_SPRAY | RESPIRATORY_TRACT | 0 refills | Status: DC

## 2018-10-13 MED ORDER — FAMOTIDINE 20 MG PO TABS: ORAL_TABLET | ORAL | 11 refills | Status: AC

## 2018-10-13 MED ORDER — PANTOPRAZOLE SODIUM 40 MG PO TBEC: 40.0000 mg | DELAYED_RELEASE_TABLET | Freq: Every day | ORAL | 2 refills | Status: DC

## 2018-10-13 MED ORDER — ALBUTEROL SULFATE HFA 108 (90 BASE) MCG/ACT IN AERS: INHALATION_SPRAY | RESPIRATORY_TRACT | 1 refills | Status: DC

## 2018-10-13 MED ORDER — PREDNISONE 10 MG PO TABS: ORAL_TABLET | ORAL | 0 refills | Status: DC

## 2018-10-13 NOTE — Patient Instructions (Addendum)
Pantoprazole (protonix) 40 mg   Take  30-60 min before first meal of the day and Pepcid (famotidine)  20 mg one after supper  until return to office - this is the best way to tell whether stomach acid is contributing to your problem.    GERD (REFLUX)  is an extremely common cause of respiratory symptoms just like yours , many times with no obvious heartburn at all.    It can be treated with medication, but also with lifestyle changes including elevation of the head of your bed (ideally with 6 -8inch blocks under the headboard of your bed),  Smoking cessation, avoidance of late meals, excessive alcohol, and avoid fatty foods, chocolate, peppermint, colas, red wine, and acidic juices such as orange juice.  NO MINT OR MENTHOL PRODUCTS SO NO COUGH DROPS  USE SUGARLESS CANDY INSTEAD (Jolley ranchers or Stover's or Life Savers) or even ice chips will also do - the key is to swallow to prevent all throat clearing. NO OIL BASED VITAMINS - use powdered substitutes.  Avoid fish oil when coughing.    Prednisone 10 mg take  4 each am x 2 days,   2 each am x 2 days,  1 each am x 2 days and stop   Increase dulera 200 to Take 2 puffs first thing in am and then another 2 puffs about 12 hours later.   Only use your albuterol as a rescue medication to be used if you can't catch your breath by resting or doing a relaxed purse lip breathing pattern.  - The less you use it, the better it will work when you need it. - Ok to use up to 2 puffs  every 4 hours if you must but call for immediate appointment if use goes up over your usual need - Don't leave home without it !!  (think of it like the spare tire for your car)   Please schedule a follow up office visit in 4 weeks, sooner if needed  with all medications /inhalers/ solutions in hand so we can verify exactly what you are taking. This includes all medications from all doctors and over the counters

## 2018-10-13 NOTE — Telephone Encounter (Signed)
Pt is scheduled a televisit with MW today. Closing this encounter.

## 2018-10-13 NOTE — Progress Notes (Signed)
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 Pulmonary Consult    Self referral. Pt states dxed with Asthma at age 58. She c/o increased SOB, wheezing and cough x 4 wks. Cough is prod with moderate clear sputum. Exertion makes her symptoms worse.   cough comes and goes x 9 months,  most recent x 4 weeks "worst ever" cough to point of vomit but previously clearly also caused reflux symptoms  Cough is more daytime than noct, more often dry than wet.  Mostly sob when coughing  rec Take Pantoprazole 40 mg Take 30- 60 min before your first and last meals of the day until cough gone for at least a week  GERD diet  Start dulera 100 Take 2 puffs first thing in am and then another 2 puffs about 12 hours later > return if not 100% happy  Only use your albuterol (ventolin) as rescue      02/26/2015 f/u ov/Shelley Pearson re: ? Cough variant asthma  Chief Complaint  Patient presents with  . Follow-up    Pt states that her cough is unchanged since the last visit. She always feels congested in the am and has wheezing during the day. She has been coughing up min white to clear sputum.   no premature awakening / doesn't take dulera until sev hours after rising  Sensation of chest congestion/wheezing daytime but able to exercise ok  rec For drainage take chlortrimeton (chlorpheniramine) 4 mg every 4 hours available over the counter (may cause drowsiness)  Increase dulera to  200 Take 2 puffs first thing in am and then another 2 puffs about 12 hours later.  Prednisone 10 mg take  4 each am x 2 days,   2 each am x 2 days,  1 each am x 2 days and stop  GERD  Diet    03/09/2017 acute extended ov/Shelley Pearson re: advair 250  Chief Complaint  Patient presents with  . Acute Visit    Pt having productive cough it is thick, sticky flem yellow color. Pt states 3 weeks ago worsen since 3rd week in Aug. Pt has SOB, wheezing, tightness in chest with congestion.  onset was insidious/ gradually worsening cough x 3 weeks assoc with sob and wheeze but despite    only using the saba once a week  Some noct awkening with subjective wheeze/ mild sob /thick mucus really not purulent with ? Mucus Plugs rec Prednisone 10 mg take  4 each am x 2 days,   2 each am x 2 days,  1 each am x 2 days and stop  Change advair to dulera 100 up to 2 every 12 hours  Work on inhaler technique:  If still coughing >> Try prilosec otc 20mg   Take 30-60 min before first meal of the day and Pepcid ac (famotidine) 20 mg one @  bedtime until cough  is completely gone for at least a week without the need for cough suppression GERD (REFLUX) diet   Virtual Visit via Telephone Note 10/13/2018 re asthma/ poor control of cough   I connected with Shelley Pearson on 10/13/18 at 540pm by telephone and verified that I am speaking with the correct person using two identifiers.   I discussed the limitations, risks, security and privacy concerns of performing an evaluation and management service by telephone and the availability of in person appointments. I also discussed with the patient that there may be a patient responsible charge related to this service. The patient expressed understanding and agreed to proceed.   History of Present Illness: worse since January 2020 /maint on dulera 100 2bid Dyspnea: not really limited from desired activities / more by incessant cough  Cough: all the time / minimal plugs  Sleeping: worse  at hs and occ noct albuterol SABA use: 3-4 x per weeks 02: none On prilosec qd    No obvious day to day or daytime variability or assoc excess/ purulent sputum or  hemoptysis or cp or chest tightness, subjective wheeze or overt sinus or hb symptoms.    Also denies any obvious fluctuation of symptoms with weather or environmental changes or other aggravating or alleviating factors except as outlined above.   Meds reviewed/ med reconciliation completed         Observations/Objective: Minimally hoarse, some dry coughing during interview, speaking in full sentences   Assessment and Plan: See problem list for active a/p's   Follow Up Instructions: See avs for instructions unique to this ov which includes revised/ updated med list     I discussed the assessment and treatment plan with the patient. The patient was provided an opportunity to ask questions and all were answered. The patient agreed with the plan and demonstrated an understanding of the instructions.   The patient was advised to call back or seek an in-person evaluation if the symptoms worsen or if the condition fails to improve as anticipated.  I provided 25 minutes of non-face-to-face time during this encounter.   Sandrea HughsMichael Elvin Banker, MD

## 2018-10-15 ENCOUNTER — Encounter: Payer: Self-pay | Admitting: Internal Medicine

## 2018-10-15 NOTE — Assessment & Plan Note (Signed)
Onset symptoms in mid 20s - 03/09/2017 flare on advair 250 > change back  to dulera 100 2bid  - 03/09/2017  After extensive coaching HFA effectiveness =    90% from a baseline of 75%   - 10/13/2018 increased dulera to 200 bid and max gerd rx due to worse cough since jan 2020    DDX of  difficult airways management almost all start with A and  include Adherence, Ace Inhibitors, Acid Reflux, Active Sinus Disease, Alpha 1 Antitripsin deficiency, Anxiety masquerading as Airways dz,  ABPA,  Allergy(esp in young), Aspiration (esp in elderly), Adverse effects of meds,  Active smoking or vaping, A bunch of PE's (a small clot burden can't cause this syndrome unless there is already severe underlying pulm or vascular dz with poor reserve) plus two Bs  = Bronchiectasis and Beta blocker use..and one C= CHF    Adherence is always the initial "prime suspect" and is a multilayered concern that requires a "trust but verify" approach in every patient - starting with knowing how to use medications, especially inhalers, correctly, keeping up with refills and understanding the fundamental difference between maintenance and prns vs those medications only taken for a very short course and then stopped and not refilled.  - return in 4 weeks with all meds in hand using a trust but verify approach to confirm accurate Medication  Reconciliation The principal here is that until we are certain that the  patients are doing what we've asked, it makes no sense to ask them to do more.    ? Acid (or non-acid) GERD > always difficult to exclude as up to 75% of pts in some series report no assoc GI/ Heartburn symptoms> rec max (24h)  acid suppression and diet restrictions/ reviewed and instructions given in writing.   ? Allergy /asthma > short course pred and increase dulera to 200 2bid then return in 4 weeks for face to face ov and consider adding singulair then   ? Adverse effects of meds > doubt the hfa ics are making her cough but  did warn her it is possible and the higher dose of ics may actually trigger more cough so may need to consider spacer to offset if this happens  ? Anxiety > usually at the bottom of this list of usual suspects but should be somewhat  higher on this pt's based on H and P as note already on psychotropics and may interfere with adherence and also interpretation of response or lack thereof to symptom management which can be quite subjective.

## 2018-11-06 DIAGNOSIS — Z0000 Encounter for general adult medical examination without abnormal findings: Secondary | ICD-10-CM | POA: Diagnosis not present

## 2018-11-06 DIAGNOSIS — F419 Anxiety disorder, unspecified: Secondary | ICD-10-CM | POA: Diagnosis not present

## 2018-11-06 DIAGNOSIS — J45909 Unspecified asthma, uncomplicated: Secondary | ICD-10-CM | POA: Diagnosis not present

## 2019-02-08 ENCOUNTER — Telehealth: Payer: Self-pay

## 2019-02-08 NOTE — Telephone Encounter (Signed)
Ok with signing it for short run  but if it's for respiratory problems I need her to make a f/u appt to sort out what pulmonary problem is requiring use of [place] parking

## 2019-02-08 NOTE — Telephone Encounter (Signed)
Patient dropped off an application for a handicap placard. I will give to leslie for Dr. Melvyn Novas to sign.

## 2019-02-08 NOTE — Telephone Encounter (Signed)
Pt with Cough Variant Asthma not seen since April 2020- supposed to f/u in 4 wks and never did Are you wanting to sign this handicap placard for her??

## 2019-02-08 NOTE — Telephone Encounter (Signed)
Filled out and placed in Dr Gustavus Bryant lookat to be signed

## 2019-02-09 NOTE — Telephone Encounter (Signed)
I placed this up front for pick up  [facility]

## 2019-03-27 NOTE — Telephone Encounter (Signed)
Pt called back regarding form.  Let her know form was up front.  She said she would come pick up form tomorrow.  [phone].

## 2019-09-20 DIAGNOSIS — Z1231 Encounter for screening mammogram for malignant neoplasm of breast: Secondary | ICD-10-CM | POA: Diagnosis not present

## 2019-09-20 DIAGNOSIS — Z01419 Encounter for gynecological examination (general) (routine) without abnormal findings: Secondary | ICD-10-CM | POA: Diagnosis not present

## 2019-09-20 DIAGNOSIS — R635 Abnormal weight gain: Secondary | ICD-10-CM | POA: Diagnosis not present

## 2019-09-20 DIAGNOSIS — Z6830 Body mass index (BMI) 30.0-30.9, adult: Secondary | ICD-10-CM | POA: Diagnosis not present

## 2019-11-05 DIAGNOSIS — R946 Abnormal results of thyroid function studies: Secondary | ICD-10-CM | POA: Diagnosis not present

## 2019-11-05 DIAGNOSIS — E7800 Pure hypercholesterolemia, unspecified: Secondary | ICD-10-CM | POA: Diagnosis not present

## 2019-11-05 DIAGNOSIS — Z0000 Encounter for general adult medical examination without abnormal findings: Secondary | ICD-10-CM | POA: Diagnosis not present

## 2019-11-06 ENCOUNTER — Other Ambulatory Visit: Payer: Self-pay | Admitting: Family Medicine

## 2019-11-06 DIAGNOSIS — Z1231 Encounter for screening mammogram for malignant neoplasm of breast: Secondary | ICD-10-CM

## 2019-11-12 DIAGNOSIS — M25511 Pain in right shoulder: Secondary | ICD-10-CM | POA: Diagnosis not present

## 2019-11-12 DIAGNOSIS — Z0000 Encounter for general adult medical examination without abnormal findings: Secondary | ICD-10-CM | POA: Diagnosis not present

## 2019-11-12 DIAGNOSIS — F419 Anxiety disorder, unspecified: Secondary | ICD-10-CM | POA: Diagnosis not present

## 2019-11-12 DIAGNOSIS — F339 Major depressive disorder, recurrent, unspecified: Secondary | ICD-10-CM | POA: Diagnosis not present

## 2019-11-12 DIAGNOSIS — M25561 Pain in right knee: Secondary | ICD-10-CM | POA: Diagnosis not present

## 2019-11-12 DIAGNOSIS — J45909 Unspecified asthma, uncomplicated: Secondary | ICD-10-CM | POA: Diagnosis not present

## 2019-11-12 DIAGNOSIS — M25512 Pain in left shoulder: Secondary | ICD-10-CM | POA: Diagnosis not present

## 2019-11-12 DIAGNOSIS — Z23 Encounter for immunization: Secondary | ICD-10-CM | POA: Diagnosis not present

## 2019-11-28 ENCOUNTER — Ambulatory Visit: Payer: Self-pay

## 2019-11-28 ENCOUNTER — Ambulatory Visit (INDEPENDENT_AMBULATORY_CARE_PROVIDER_SITE_OTHER): Payer: BC Managed Care – PPO | Admitting: Orthopedic Surgery

## 2019-11-28 ENCOUNTER — Other Ambulatory Visit: Payer: Self-pay

## 2019-11-28 DIAGNOSIS — M79601 Pain in right arm: Secondary | ICD-10-CM

## 2019-11-28 DIAGNOSIS — M170 Bilateral primary osteoarthritis of knee: Secondary | ICD-10-CM

## 2019-11-28 DIAGNOSIS — M25511 Pain in right shoulder: Secondary | ICD-10-CM

## 2019-11-28 DIAGNOSIS — M25561 Pain in right knee: Secondary | ICD-10-CM | POA: Diagnosis not present

## 2019-11-28 DIAGNOSIS — M25562 Pain in left knee: Secondary | ICD-10-CM | POA: Diagnosis not present

## 2019-11-28 DIAGNOSIS — M79602 Pain in left arm: Secondary | ICD-10-CM | POA: Diagnosis not present

## 2019-12-01 NOTE — Progress Notes (Signed)
Office Visit Note   Patient: Shelley Pearson           Date of Birth: 1961-05-04           MRN: [id number] Visit Date: 11/28/2019 Requested by: No referring provider defined for this encounter. PCP: Thressa Sheller, MD (Inactive)  Subjective: Chief Complaint  Patient presents with  . Knee Pain  . Arm Pain    HPI: Shelley Pearson is a 59 y.o. female who presents to the office complaining of bilateral knee pain as well as right shoulder pain.  Patient's main concern today is right shoulder pain.  She describes anterior pain near the clavicle without any history of injury.  Pain began 1 month ago.  Pain wakes her up at night.  She does note neck pain but denies any radicular pain down the arm or numbness/tingling.  She works as a Therapist, nutritional.  Pain is worse when she is sitting.  She has no change in pain with arm position.  She has no history of neck/shoulder surgery or injection or MRI.  Denies any stiffness or weakness.  Patient also complains of bilateral knee pain.  Her right knee bothers her more.  She localizes pain to the medial aspect of bilateral knees.  Denies any groin pain or radicular symptoms but does note low back pain.  She denies any mechanical symptoms but she does have rare instability episodes.  She has swelling occasionally of both knees.  Denies any history of diabetes or thyroid disorders..                ROS:  All systems reviewed are negative as they relate to the chief complaint within the history of present illness.  Patient denies fevers or chills.  Assessment & Plan: Visit Diagnoses:  1. Pain in both upper extremities   2. Left knee pain, unspecified chronicity   3. Right knee pain, unspecified chronicity   4. Arthralgia of right acromioclavicular joint   5. Primary osteoarthritis of knees, bilateral     Plan: Patient is a 59 year old female who presents complaining of primarily right shoulder pain as well as bilateral knee pain.   She has pain that is in the anterior right shoulder around her clavicle and is worse with palpation to the [facility] joint.  Additionally pain is reproduced in this location when her arm is abducted or the arm is crossed over the front of her body.  She has no weakness of the rotator cuff and excellent range of motion on exam.  Frozen shoulder unlikely.  AC joint symptoms more likely.  Regarding her bilateral knee pain, this does not cause significant interruption to her daily life and radiographs reveal medial compartment narrowing and spurring bilaterally.  She does have mild to moderate degenerative changes throughout her cervical spine.  Radiographs of the right shoulder reveal mild joint space narrowing of the AC joint but no other significant findings.  Impression is AC joint arthralgia.  Under ultrasound guidance a right AC joint was administered successfully.  Patient tolerated procedure well.  Follow-up as needed.  Recommend patient try topical Voltaren as well to apply to the [facility] joint.  The knees may require further treatment such as injections in the future.  For now nonweightbearing quad strengthening exercises encouraged.  Follow-Up Instructions: No follow-ups on file.   Orders:  Orders Placed This Encounter  Procedures  . XR KNEE 3 VIEW LEFT  . XR Knee 1-2 Views Right  . XR  Cervical Spine 2 or 3 views  . XR Shoulder Right   No orders of the defined types were placed in this encounter.     Procedures: Medium Joint Inj: R acromioclavicular on 12/02/2019 11:28 AM Indications: diagnostic evaluation and pain Details: 25 G 1.5 in needle, ultrasound-guided superior approach Medications: 3 mL lidocaine 1 %; 0.66 mL bupivacaine 0.25 %; 13.33 mg methylPREDNISolone acetate 40 MG/ML Outcome: tolerated well, no immediate complications Procedure, treatment alternatives, risks and benefits explained, specific risks discussed. Consent was given by the patient. Immediately prior to procedure a time out was  called to verify the correct patient, procedure, equipment, support staff and site/side marked as required. Patient was prepped and draped in the usual sterile fashion.       Clinical Data: No additional findings.  Objective: Vital Signs: LMP 04/22/2011   Physical Exam:  Constitutional: Patient appears well-developed HEENT:  Head: Normocephalic Eyes:EOM are normal Neck: Normal range of motion Cardiovascular: Normal rate Pulmonary/chest: Effort normal Neurologic: Patient is alert Skin: Skin is warm Psychiatric: Patient has normal mood and affect  Ortho Exam:  Right shoulder Exam Able to fully forward flex and abduct shoulder overhead No loss of ER relative to the other shoulder.  Good endpoint with ER Moderate tenderness palpation over the right AC joint.  Pain elicited in this location with passive abduction as well as cross arm adduction. No tenderness palpation over the bicipital groove Good subscapularis, supraspinatus, and infraspinatus strength Negative Hawkins impingement 5/5 grip strength, forearm pronation/supination, and bicep strength  no tenderness to palpation throughout the axial cervical spine or paraspinal musculature.  Bilateral knee Exam No effusion Mild tenderness palpation over the medial joint line bilaterally Extensor mechanism intact No TTP over the lateral jointlines, quad tendon, patellar tendon, pes anserinus, patella, tibial tubercle, LCL/MCL insertions Stable to varus/valgus stresses.  Stable to anterior/posterior drawer Extension to 0 degrees Flexion > 90 degrees  Specialty Comments:  No specialty comments available.  Imaging: No results found.   PMFS History: Patient Active Problem List   Diagnosis Date Noted  . Cough variant asthma 11/12/2014  . Plantar fasciitis 12/25/2012  . Right elbow pain 12/25/2012  . Pneumothorax, right 06/28/2011  . Multiple fractures of ribs of right side 06/28/2011  . Fall with injury 06/28/2011  .  Asthma 06/28/2011  . Recurrent UTI 06/28/2011  . Alcohol use 06/28/2011   Past Medical History:  Diagnosis Date  . Asthma   . History of recurrent UTIs     Family History  Problem Relation Age of Onset  . Diabetes Mother   . Hyperlipidemia Mother   . Hypertension Mother   . Asthma Mother   . Skin cancer Mother   . Prostate cancer Father   . Skin cancer Father   . Heart attack Neg Hx   . Sudden death Neg Hx     Past Surgical History:  Procedure Laterality Date  . APPENDECTOMY    . BLADDER SURGERY    . ENDOMETRIAL ABLATION    . HAND SURGERY     Social History   Occupational History  . Occupation: Librarian  Tobacco Use  . Smoking status: Never Smoker  . Smokeless tobacco: Never Used  Substance and Sexual Activity  . Alcohol use: No    Alcohol/week: 0.0 standard drinks  . Drug use: No  . Sexual activity: Not on file

## 2019-12-02 ENCOUNTER — Encounter: Payer: Self-pay | Admitting: Orthopedic Surgery

## 2019-12-02 DIAGNOSIS — M25511 Pain in right shoulder: Secondary | ICD-10-CM | POA: Diagnosis not present

## 2019-12-02 MED ORDER — LIDOCAINE HCL 1 % IJ SOLN
3.0000 mL | INTRAMUSCULAR | Status: AC | PRN
  Administered 2019-12-02: 3 mL

## 2019-12-02 MED ORDER — METHYLPREDNISOLONE ACETATE 40 MG/ML IJ SUSP
13.3300 mg | INTRAMUSCULAR | Status: AC | PRN
  Administered 2019-12-02: 13.33 mg via INTRA_ARTICULAR

## 2019-12-02 MED ORDER — BUPIVACAINE HCL 0.25 % IJ SOLN
0.6600 mL | INTRAMUSCULAR | Status: AC | PRN
  Administered 2019-12-02: .66 mL via INTRA_ARTICULAR

## 2020-04-18 ENCOUNTER — Other Ambulatory Visit (HOSPITAL_BASED_OUTPATIENT_CLINIC_OR_DEPARTMENT_OTHER): Payer: Self-pay | Admitting: Internal Medicine

## 2020-04-18 ENCOUNTER — Ambulatory Visit: Payer: BC Managed Care – PPO | Attending: Internal Medicine

## 2020-04-18 DIAGNOSIS — Z23 Encounter for immunization: Secondary | ICD-10-CM

## 2020-04-18 NOTE — Progress Notes (Signed)
   Covid-19 Vaccination Clinic  Name:  TATIONA STECH    MRN: [id number] DOB: 03/23/1961  04/18/2020  Ms. Milanese was observed post Covid-19 immunization for 15 minutes without incident. She was provided with Vaccine Information Sheet and instruction to access the V-Safe system.  Vaccinated by [facility] Ms. Modisette was instructed to call 911 with any severe reactions post vaccine: Marland Kitchen Difficulty breathing  . Swelling of face and throat  . A fast heartbeat  . A bad rash all over body  . Dizziness and weakness   Immunizations Administered    Name Date Dose VIS Date Route   JANSSEN COVID-19 VACCINE 04/18/2020  1:52 PM 0.5 mL 08/25/2019 Intramuscular   Manufacturer: [employer]   Lot: [id number]   NDC: 92924-462-86

## 2020-04-25 MED FILL — JANSSEN COVID-19 VACCINE 0.: 0.5 | 1 days supply | Qty: 1 | Fill #0

## 2020-11-10 DIAGNOSIS — R7301 Impaired fasting glucose: Secondary | ICD-10-CM | POA: Diagnosis not present

## 2020-11-10 DIAGNOSIS — R635 Abnormal weight gain: Secondary | ICD-10-CM | POA: Diagnosis not present

## 2020-11-10 DIAGNOSIS — R946 Abnormal results of thyroid function studies: Secondary | ICD-10-CM | POA: Diagnosis not present

## 2020-11-10 DIAGNOSIS — E785 Hyperlipidemia, unspecified: Secondary | ICD-10-CM | POA: Diagnosis not present

## 2020-11-13 DIAGNOSIS — F419 Anxiety disorder, unspecified: Secondary | ICD-10-CM | POA: Diagnosis not present

## 2020-11-13 DIAGNOSIS — Z1211 Encounter for screening for malignant neoplasm of colon: Secondary | ICD-10-CM | POA: Diagnosis not present

## 2020-11-13 DIAGNOSIS — Z0000 Encounter for general adult medical examination without abnormal findings: Secondary | ICD-10-CM | POA: Diagnosis not present

## 2020-11-13 DIAGNOSIS — R7309 Other abnormal glucose: Secondary | ICD-10-CM | POA: Diagnosis not present

## 2021-02-20 DIAGNOSIS — L259 Unspecified contact dermatitis, unspecified cause: Secondary | ICD-10-CM | POA: Diagnosis not present

## 2021-04-28 ENCOUNTER — Ambulatory Visit: Payer: BC Managed Care – PPO | Admitting: Orthopaedic Surgery

## 2021-04-30 ENCOUNTER — Ambulatory Visit: Payer: Self-pay

## 2021-04-30 ENCOUNTER — Ambulatory Visit (INDEPENDENT_AMBULATORY_CARE_PROVIDER_SITE_OTHER): Payer: BC Managed Care – PPO | Admitting: Orthopaedic Surgery

## 2021-04-30 ENCOUNTER — Other Ambulatory Visit: Payer: Self-pay

## 2021-04-30 DIAGNOSIS — M25562 Pain in left knee: Secondary | ICD-10-CM | POA: Diagnosis not present

## 2021-04-30 DIAGNOSIS — M25572 Pain in left ankle and joints of left foot: Secondary | ICD-10-CM

## 2021-04-30 NOTE — Progress Notes (Signed)
Office Visit Note   Patient: Shelley Pearson           Date of Birth: 27-Apr-1961           MRN: [id number] Visit Date: 04/30/2021              Requested by: No referring provider defined for this encounter. PCP: Thayer Headings, MD (Inactive)   Assessment & Plan: Visit Diagnoses:  1. Left knee pain, unspecified chronicity   2. Pain in left ankle and joints of left foot     Plan: Impression is left knee osteoarthritis and left ankle grade 1-2 sprain.  In regards to the left knee, we have discussed cortisone injection for which she would like to proceed.  In regards to the left ankle, she will continue wearing the ASO brace for the next 1 to 2 weeks and wean as needed.  Ice and elevate as needed.  If her symptoms have not improved over the next few weeks, she will follow-up with [place] for repeat evaluation.  Otherwise, call with concerns or questions in the meantime.  Follow-Up Instructions: Return if symptoms worsen or fail to improve.   Orders:  Orders Placed This Encounter  Procedures   XR KNEE 3 VIEW LEFT   XR Ankle Complete Left   No orders of the defined types were placed in this encounter.     Procedures: No procedures performed   Clinical Data: No additional findings.   Subjective: Chief Complaint  Patient presents with   Left Knee - Pain   Left Ankle - Pain    HPI patient is a pleasant 60 year old female who comes in today with left knee and left ankle pain.  In regards to her left knee, she has had pain for the past year and has progressively worsened.  The pain she has is primarily to the medial aspect but does note an occasional pain to the lateral aspect as well as popliteal fossa.  She describes this as a constant pain worse with stair climbing and walking.  No mechanical symptoms or instability.  She has been taking ibuprofen without relief.  No previous cortisone injection.  In regards to her left ankle, she notes that she possibly missed the bottom step  going down with set of stairs about a week and a half ago causing her to invert her left ankle.  She has had pain to the anterolateral ankle since.  Worse with walking.  She has been using her daughter's ASO brace which does seem to help.  Review of Systems as detailed in HPI.  All others reviewed and are negative.   Objective: Vital Signs: LMP 04/22/2011   Physical Exam well-developed well-nourished female no acute distress.  Alert and oriented x3.  Ortho Exam left knee exam shows no effusion.  Range of motion 0 to 115 degrees.  Medial joint line tenderness.  Moderate patellofemoral crepitus.  Ligaments are stable.  She is neurovascular intact distally.  Left ankle exam shows mild swelling.  No bony tenderness.  Mild tenderness ATFL.  Painless range of motion.  She is neurovascular intact distally.  Specialty Comments:  No specialty comments available.  Imaging: XR Ankle Complete Left  Result Date: 04/30/2021 X-rays demonstrate an os trigonum left ankle  XR KNEE 3 VIEW LEFT  Result Date: 04/30/2021 X-rays demonstrate moderate degenerative changes to the medial compartment    PMFS History: Patient Active Problem List   Diagnosis Date Noted   Cough variant asthma 11/12/2014  Plantar fasciitis 12/25/2012   Right elbow pain 12/25/2012   Pneumothorax, right 06/28/2011   Multiple fractures of ribs of right side 06/28/2011   Fall with injury 06/28/2011   Asthma 06/28/2011   Recurrent UTI 06/28/2011   Alcohol use 06/28/2011   Past Medical History:  Diagnosis Date   Asthma    History of recurrent UTIs     Family History  Problem Relation Age of Onset   Diabetes Mother    Hyperlipidemia Mother    Hypertension Mother    Asthma Mother    Skin cancer Mother    Prostate cancer Father    Skin cancer Father    Heart attack Neg Hx    Sudden death Neg Hx     Past Surgical History:  Procedure Laterality Date   APPENDECTOMY     BLADDER SURGERY     ENDOMETRIAL ABLATION      HAND SURGERY     Social History   Occupational History   Occupation: Comptroller  Tobacco Use   Smoking status: Never   Smokeless tobacco: Never  Vaping Use   Vaping Use: Never used  Substance and Sexual Activity   Alcohol use: No    Alcohol/week: 0.0 standard drinks   Drug use: No   Sexual activity: Not on file

## 2021-10-13 ENCOUNTER — Encounter: Payer: Self-pay | Admitting: Pulmonary Disease

## 2021-10-13 ENCOUNTER — Ambulatory Visit (INDEPENDENT_AMBULATORY_CARE_PROVIDER_SITE_OTHER): Payer: BC Managed Care – PPO | Admitting: Pulmonary Disease

## 2021-10-13 VITALS — BP 122/78 | HR 69 | Temp 98.2°F | Ht 65.0 in | Wt 189.8 lb

## 2021-10-13 DIAGNOSIS — J4541 Moderate persistent asthma with (acute) exacerbation: Secondary | ICD-10-CM

## 2021-10-13 DIAGNOSIS — K219 Gastro-esophageal reflux disease without esophagitis: Secondary | ICD-10-CM

## 2021-10-13 DIAGNOSIS — R051 Acute cough: Secondary | ICD-10-CM

## 2021-10-13 MED ORDER — DULERA 200-5 MCG/ACT IN AERO: INHALATION_SPRAY | RESPIRATORY_TRACT | 0 refills | Status: DC

## 2021-10-13 MED ORDER — BENZONATATE 200 MG PO CAPS: 200.0000 mg | ORAL_CAPSULE | Freq: Three times a day (TID) | ORAL | 1 refills | Status: AC | PRN

## 2021-10-13 MED ORDER — ALBUTEROL SULFATE HFA 108 (90 BASE) MCG/ACT IN AERS: INHALATION_SPRAY | RESPIRATORY_TRACT | 1 refills | Status: DC

## 2021-10-13 MED ORDER — MONTELUKAST SODIUM 10 MG PO TABS: 10.0000 mg | ORAL_TABLET | Freq: Every day | ORAL | 11 refills | Status: DC

## 2021-10-13 MED ORDER — PANTOPRAZOLE SODIUM 40 MG PO TBEC: 40.0000 mg | DELAYED_RELEASE_TABLET | Freq: Every day | ORAL | 2 refills | Status: AC

## 2021-10-13 MED ORDER — IPRATROPIUM-ALBUTEROL 0.5-2.5 (3) MG/3ML IN SOLN
3.0000 mL | Freq: Once | RESPIRATORY_TRACT | Status: AC
  Administered 2021-10-13: 3 mL via RESPIRATORY_TRACT

## 2021-10-13 MED ORDER — PREDNISONE 10 MG PO TABS: ORAL_TABLET | ORAL | 0 refills | Status: AC

## 2021-10-13 NOTE — Progress Notes (Signed)
? ?Synopsis: Referred in April 2023 for  ? ?Subjective:  ? ?PATIENT ID: Shelley Patricia GENDER: female DOB: 1960/09/11, MRN: [id number] ? ?HPI ? ?Chief Complaint  ?Patient presents with  ? Follow-up  ?  Acute visit. Increased coughing, wheezing. Productive cough with thick yellow secretions.  Having to sleep with pillows to prop up. Also having pain in right ear. SOB and coughing with talking and walking.  ? ?Shelley Pearson is a 61 year old woman, never smoker with history of asthma who returns to pulmonary clinic for acute visit.  ? ?She reports increased shortness of breath, chest tightness, cough and wheezing over the past week due to allergies. She reports mild issues due to spring allergies in the past. She thinks she  was previously on singulair in the past. She also reports some fullness of the right ear. She denies sinus congestion or drainage. She denies any increase in her GERD symptoms as she remains on pantoprazole. She denies fevers, chills or night sweats.  ? ?She is a never smoker. Typical asthma triggers include strong fragrances and exertional activity.  ? ?Past Medical History:  ?Diagnosis Date  ? Asthma   ? History of recurrent UTIs   ?  ? ?Family History  ?Problem Relation Age of Onset  ? Diabetes Mother   ? Hyperlipidemia Mother   ? Hypertension Mother   ? Asthma Mother   ? Skin cancer Mother   ? Prostate cancer Father   ? Skin cancer Father   ? Heart attack Neg Hx   ? Sudden death Neg Hx   ?  ? ?Social History  ? ?Socioeconomic History  ? Marital status: Married  ?  Spouse name: Not on file  ? Number of children: Not on file  ? Years of education: Not on file  ? Highest education level: Not on file  ?Occupational History  ? Occupation: Comptroller  ?Tobacco Use  ? Smoking status: Never  ? Smokeless tobacco: Never  ?Vaping Use  ? Vaping Use: Never used  ?Substance and Sexual Activity  ? Alcohol use: No  ?  Alcohol/week: 0.0 standard drinks  ? Drug use: No  ? Sexual activity: Not on file  ?Other  Topics Concern  ? Not on file  ?Social History Narrative  ? Not on file  ? ?Social Determinants of Health  ? ?Financial Resource Strain: Not on file  ?Food Insecurity: Not on file  ?Transportation Needs: Not on file  ?Physical Activity: Not on file  ?Stress: Not on file  ?Social Connections: Not on file  ?Intimate Partner Violence: Not on file  ?  ? ?Allergies  ?Allergen Reactions  ? Suprax [Cefixime] Hives  ?  ? ?Outpatient Medications Prior to Visit  ?Medication Sig Dispense Refill  ? escitalopram (LEXAPRO) 20 MG tablet Take 20 mg by mouth daily.      ? famotidine (PEPCID) 20 MG tablet One after supper 30 tablet 11  ? albuterol (PROAIR HFA) 108 (90 Base) MCG/ACT inhaler 2 puffs every 4 hours as needed only  if your can't catch your breath 1 Inhaler 1  ? albuterol (PROVENTIL HFA;VENTOLIN HFA) 108 (90 BASE) MCG/ACT inhaler Inhale 2 puffs into the lungs every 6 (six) hours as needed. For shortness of breath or wheezing ?     ? mometasone-formoterol (DULERA) 200-5 MCG/ACT AERO Take 2 puffs first thing in am and then another 2 puffs about 12 hours later. 1 Inhaler 0  ? pantoprazole (PROTONIX) 40 MG tablet Take 1 tablet (40  mg total) by mouth daily. Take 30-60 min before first meal of the day 30 tablet 2  ? predniSONE (DELTASONE) 10 MG tablet Take  4 each am x 2 days,   2 each am x 2 days,  1 each am x 2 days and stop 14 tablet 0  ? ?No facility-administered medications prior to visit.  ? ? ?Review of Systems  ?Constitutional:  Negative for chills, fever, malaise/fatigue and weight loss.  ?HENT:  Negative for congestion, sinus pain and sore throat.   ?Eyes: Negative.   ?Respiratory:  Positive for cough, sputum production, shortness of breath and wheezing. Negative for hemoptysis.   ?Cardiovascular:  Negative for chest pain, palpitations, orthopnea, claudication and leg swelling.  ?Gastrointestinal:  Negative for abdominal pain, heartburn, nausea and vomiting.  ?Genitourinary: Negative.   ?Musculoskeletal:  Negative for  joint pain and myalgias.  ?Skin:  Negative for rash.  ?Neurological:  Negative for weakness.  ?Endo/Heme/Allergies:  Positive for environmental allergies.  ?Psychiatric/Behavioral: Negative.    ? ?Objective:  ? ?Vitals:  ? 10/13/21 1123  ?BP: 122/78  ?Pulse: 69  ?Temp: 98.2 ?F (36.8 ?C)  ?TempSrc: Oral  ?SpO2: 96%  ?Weight: 189 lb 12.8 oz (86.1 kg)  ?Height: 5\' 5"  (1.651 m)  ? ?Physical Exam ?Constitutional:   ?   General: She is not in acute distress. ?   Appearance: She is not ill-appearing.  ?HENT:  ?   Head: Normocephalic and atraumatic.  ?   Right Ear: No swelling or tenderness. Tympanic membrane is not injected or erythematous.  ?   Left Ear: No swelling or tenderness. Tympanic membrane is not injected or erythematous.  ?Eyes:  ?   General: No scleral icterus. ?   Conjunctiva/sclera: Conjunctivae normal.  ?   Pupils: Pupils are equal, round, and reactive to light.  ?Cardiovascular:  ?   Rate and Rhythm: Normal rate and regular rhythm.  ?   Pulses: Normal pulses.  ?   Heart sounds: Normal heart sounds. No murmur heard. ?Pulmonary:  ?   Effort: Pulmonary effort is normal.  ?   Breath sounds: Decreased air movement present. Wheezing (diffuse bilateral) present. No rhonchi or rales.  ?Abdominal:  ?   General: Bowel sounds are normal.  ?   Palpations: Abdomen is soft.  ?Musculoskeletal:  ?   Right lower leg: No edema.  ?   Left lower leg: No edema.  ?Lymphadenopathy:  ?   Cervical: No cervical adenopathy.  ?Skin: ?   General: Skin is warm and dry.  ?Neurological:  ?   General: No focal deficit present.  ?   Mental Status: She is alert.  ?Psychiatric:     ?   Mood and Affect: Mood normal.     ?   Behavior: Behavior normal.     ?   Thought Content: Thought content normal.     ?   Judgment: Judgment normal.  ? ? ?CBC ?   ?Component Value Date/Time  ? WBC 4.1 06/28/2011 0954  ? RBC 4.07 06/28/2011 0954  ? HGB 12.0 06/28/2011 0954  ? HCT 37.6 06/28/2011 0954  ? PLT 179 06/28/2011 0954  ? MCV 92.4 06/28/2011 0954  ? MCH  29.5 06/28/2011 0954  ? MCHC 31.9 06/28/2011 0954  ? RDW 14.6 06/28/2011 0954  ? LYMPHSABS 1.1 06/27/2011 2219  ? MONOABS 0.5 06/27/2011 2219  ? EOSABS 0.1 06/27/2011 2219  ? BASOSABS 0.0 06/27/2011 2219  ? ? ?  Latest Ref Rng & Units 06/28/2011  ?  9:54 AM 06/27/2011  ? 10:19 PM  ?BMP  ?Glucose 70 - 99 mg/dL 72   096108    ?BUN 6 - 23 mg/dL 12   22    ?Creatinine 0.50 - 1.10 mg/dL 0.450.66   4.090.90    ?Sodium 135 - 145 mEq/L 142   140    ?Potassium 3.5 - 5.1 mEq/L 3.1   3.8    ?Chloride 96 - 112 mEq/L 105   102    ?CO2 19 - 32 mEq/L 29   29    ?Calcium 8.4 - 10.5 mg/dL 8.9   9.3    ? ?Chest imaging: ? ?PFT: ?   ? View : No data to display.  ?  ?  ?  ? ? ?Labs: ? ?Path: ? ?Echo: ? ?Heart Catheterization: ? ?Assessment & Plan:  ? ?Moderate persistent asthma with acute exacerbation - Plan: mometasone-formoterol (DULERA) 200-5 MCG/ACT AERO, albuterol (PROAIR HFA) 108 (90 Base) MCG/ACT inhaler, montelukast (SINGULAIR) 10 MG tablet, predniSONE (DELTASONE) 10 MG tablet, ipratropium-albuterol (DUONEB) 0.5-2.5 (3) MG/3ML nebulizer solution 3 mL ? ?Gastroesophageal reflux disease without esophagitis - Plan: pantoprazole (PROTONIX) 40 MG tablet ? ?Acute cough - Plan: benzonatate (TESSALON) 200 MG capsule ? ?Discussion: ?Shelley BibleKristin Doerner is a 61 year old woman, never smoker with history of asthma who returns to pulmonary clinic for acute visit.  ? ?She has acute exacerbation of her asthma. We will treat her with prednisone taper and start her on singulair 10mg  daily. She is to use benzonatate as needed for cough. ? ?We will give her a duoneb nebulizer treatment in clinic. ? ?She is to continue dulera 200-325mcg 2 puffs twice daily and albuterol inhaler as needed.  ? ?She is to continue pantoprazole daily for GERD. ? ?Follow up in 3 months with Dr. Sherene SiresWert. ? ?Shelley ComasJonathan Elianne Gubser, MD ?[facility] ?Office: [phone] ? ? ?Current Outpatient Medications:  ?  benzonatate (TESSALON) 200 MG capsule, Take 1 capsule (200 mg total)  by mouth 3 (three) times daily as needed for cough., Disp: 30 capsule, Rfl: 1 ?  montelukast (SINGULAIR) 10 MG tablet, Take 1 tablet (10 mg total) by mouth at bedtime., Disp: 30 tablet, Rfl: 11 ?  predni

## 2021-10-13 NOTE — Patient Instructions (Addendum)
You appear to have an asthma exacerbation due to allergies. ? ?Start prednisone taper: ?40mg  daily for 3 days ?30mg  daily for 3 days ?20mg  daily for 3 days ?10mg  daily for 3 days ? ?Continue dulera 2 puffs twice daily ?- rinse mouth out after each use ? ?Use albuterol inhaler as needed ? ?Start monteulkast 10mg  daily for allergies ? ?Continue pantoprazole daily for reflux disease ? ?Follow up with Dr. in 3-6 months for follow up ?

## 2021-10-19 ENCOUNTER — Other Ambulatory Visit: Payer: Self-pay | Admitting: Internal Medicine

## 2021-10-19 NOTE — Telephone Encounter (Signed)
Called the pt and there was no answer- LMTCB    

## 2021-10-19 NOTE — Telephone Encounter (Signed)
Called and spoke with patient to let her know of recs from Dr. Sherene Sires. She verified preferred pharmacy. RX has been called into [facility] [address]. Nothing further needed at this time.  ?

## 2021-10-19 NOTE — Telephone Encounter (Signed)
Dr. Sherene Sires please send in this RX TO [facility] on [address] ?

## 2021-10-19 NOTE — Telephone Encounter (Signed)
Patient is returning phone call. Patient phone number is [phone]. ?

## 2021-10-19 NOTE — Telephone Encounter (Signed)
Called and spoke with patient who states that she is having difficulty w/her cough, along w/wheezing and  productive cough with yellow sputum. she is asking for a cough syrup specifically. pt also states the benzonatate isnt working. She was seen by Dr. Francine Graven on 10/13/2021 still taking prednisone taper that was prescribed. Patient is taking Dulera, montelukast and pantoprazole.  ? ?Dr. Sherene Sires please advise ?

## 2021-10-19 NOTE — Telephone Encounter (Signed)
Can try phenergan dm 1-2 tsp q 4 h prn  ? ?#250 cc  ? ?Ov if not better or needs more ?

## 2021-11-12 ENCOUNTER — Telehealth: Payer: Self-pay | Admitting: Internal Medicine

## 2021-11-12 MED ORDER — AMOXICILLIN-POT CLAVULANATE 875-125 MG PO TABS: 1.0000 | ORAL_TABLET | Freq: Two times a day (BID) | ORAL | 0 refills | Status: AC

## 2021-11-12 MED ORDER — PREDNISONE 10 MG PO TABS: ORAL_TABLET | ORAL | 0 refills | Status: DC

## 2021-11-12 NOTE — Telephone Encounter (Signed)
Called and spoke with pt who states she does not feel like she is any better after her last OV with JD 4/18. Pt said she is still coughing. Said that the phlegm had become clear but then it started turning yellow again and is thick in consistency.  Pt said she also has problems with congestion, ears being stopped up, and also has been wheezing.  Pt is using her dulera inhaler as prescribed and is taking the allergy meds as well as the acid reflux meds and tessalon if needed.  Pt said she can go a couple days without having to use her rescue inhaler but said if she does need to use it, she does not have to use it more than once a day. Pt denies any fever.   With pt still having problems with her asthma, she wants to know what can be recommended. With Dr. Francine Graven not being avail today, routing to Dr. Thora Lance whom is provider of the day. Please advise.

## 2021-11-12 NOTE — Telephone Encounter (Signed)
Recommended neti pot irrigation/daily flonase for component of sinus congestion/postnasal drainage. She would like to try antibiotic and steroid course. I have prescribed 7d course augmentin, prednisone taper. All questions addressed.

## 2021-11-17 ENCOUNTER — Other Ambulatory Visit: Payer: Self-pay | Admitting: Pulmonary Disease

## 2021-11-17 DIAGNOSIS — J4541 Moderate persistent asthma with (acute) exacerbation: Secondary | ICD-10-CM

## 2022-01-19 ENCOUNTER — Ambulatory Visit: Payer: BC Managed Care – PPO | Admitting: Internal Medicine

## 2022-02-02 ENCOUNTER — Ambulatory Visit: Payer: BC Managed Care – PPO | Admitting: Internal Medicine

## 2022-05-04 DIAGNOSIS — J40 Bronchitis, not specified as acute or chronic: Secondary | ICD-10-CM | POA: Diagnosis not present

## 2022-05-04 DIAGNOSIS — R059 Cough, unspecified: Secondary | ICD-10-CM | POA: Diagnosis not present

## 2022-06-14 DIAGNOSIS — R208 Other disturbances of skin sensation: Secondary | ICD-10-CM | POA: Diagnosis not present

## 2022-06-14 DIAGNOSIS — R300 Dysuria: Secondary | ICD-10-CM | POA: Diagnosis not present

## 2022-09-21 DIAGNOSIS — Z6830 Body mass index (BMI) 30.0-30.9, adult: Secondary | ICD-10-CM | POA: Diagnosis not present

## 2022-09-21 DIAGNOSIS — Z1151 Encounter for screening for human papillomavirus (HPV): Secondary | ICD-10-CM | POA: Diagnosis not present

## 2022-09-21 DIAGNOSIS — Z01419 Encounter for gynecological examination (general) (routine) without abnormal findings: Secondary | ICD-10-CM | POA: Diagnosis not present

## 2022-09-21 DIAGNOSIS — Z124 Encounter for screening for malignant neoplasm of cervix: Secondary | ICD-10-CM | POA: Diagnosis not present

## 2022-09-21 DIAGNOSIS — Z13820 Encounter for screening for osteoporosis: Secondary | ICD-10-CM | POA: Diagnosis not present

## 2022-09-21 DIAGNOSIS — Z1231 Encounter for screening mammogram for malignant neoplasm of breast: Secondary | ICD-10-CM | POA: Diagnosis not present

## 2022-10-05 DIAGNOSIS — E559 Vitamin D deficiency, unspecified: Secondary | ICD-10-CM | POA: Diagnosis not present

## 2022-10-05 DIAGNOSIS — F331 Major depressive disorder, recurrent, moderate: Secondary | ICD-10-CM | POA: Diagnosis not present

## 2022-10-05 DIAGNOSIS — E6609 Other obesity due to excess calories: Secondary | ICD-10-CM | POA: Diagnosis not present

## 2022-10-05 DIAGNOSIS — E782 Mixed hyperlipidemia: Secondary | ICD-10-CM | POA: Diagnosis not present

## 2022-10-05 DIAGNOSIS — J4520 Mild intermittent asthma, uncomplicated: Secondary | ICD-10-CM | POA: Diagnosis not present

## 2022-10-05 DIAGNOSIS — Z6832 Body mass index (BMI) 32.0-32.9, adult: Secondary | ICD-10-CM | POA: Diagnosis not present

## 2022-10-05 DIAGNOSIS — R5383 Other fatigue: Secondary | ICD-10-CM | POA: Diagnosis not present

## 2022-10-05 DIAGNOSIS — R7301 Impaired fasting glucose: Secondary | ICD-10-CM | POA: Diagnosis not present

## 2022-10-05 DIAGNOSIS — F411 Generalized anxiety disorder: Secondary | ICD-10-CM | POA: Diagnosis not present

## 2022-10-05 DIAGNOSIS — Z1330 Encounter for screening examination for mental health and behavioral disorders, unspecified: Secondary | ICD-10-CM | POA: Diagnosis not present

## 2023-01-25 DIAGNOSIS — L237 Allergic contact dermatitis due to plants, except food: Secondary | ICD-10-CM | POA: Diagnosis not present

## 2023-02-10 DIAGNOSIS — L237 Allergic contact dermatitis due to plants, except food: Secondary | ICD-10-CM | POA: Diagnosis not present

## 2024-03-23 ENCOUNTER — Telehealth: Payer: Self-pay | Admitting: Internal Medicine

## 2024-03-23 NOTE — Telephone Encounter (Signed)
 Patient was a walk in today requesting Dr. Darlean complete disability parking placard  form--patient has not been seen since 2020.  Will need new patient appointment with Dr. Darlean  LVM for patient to call and schedule

## 2024-03-27 ENCOUNTER — Encounter: Payer: Self-pay | Admitting: Pulmonary Disease

## 2024-03-27 ENCOUNTER — Ambulatory Visit: Admitting: Pulmonary Disease

## 2024-03-27 VITALS — BP 123/75 | HR 67 | Ht 65.0 in | Wt 159.0 lb

## 2024-03-27 DIAGNOSIS — J4541 Moderate persistent asthma with (acute) exacerbation: Secondary | ICD-10-CM

## 2024-03-27 DIAGNOSIS — J4542 Moderate persistent asthma with status asthmaticus: Secondary | ICD-10-CM

## 2024-03-27 MED ORDER — DULERA 200-5 MCG/ACT IN AERO: INHALATION_SPRAY | RESPIRATORY_TRACT | 11 refills | Status: AC

## 2024-03-27 MED ORDER — ALBUTEROL SULFATE HFA 108 (90 BASE) MCG/ACT IN AERS: INHALATION_SPRAY | RESPIRATORY_TRACT | 11 refills | Status: AC

## 2024-03-27 NOTE — Patient Instructions (Addendum)
 Continue dulera  2 puffs twice daily - rinse mouth out after each use  Use albuterol  inhaler 1-2 puffs every 4-6 hours as needed  Follow up in 1 year with Dr. Darlean

## 2024-03-27 NOTE — Progress Notes (Signed)
 Synopsis: Referred in April 2023 for   Subjective:   PATIENT ID: Shelley Pearson GENDER: female DOB: Sep 12, 1960, MRN: [id number]  HPI  Chief Complaint  Patient presents with   Medical Management of Chronic Issues   Shelley Pearson is a 63 year old woman, never smoker with history of asthma who returns to pulmonary clinic for follow up.   She has been doing well on dulera  200 2 puffs twice daily and as needed albuterol . She is requesting handicap placard due to difficulty walking long distances that can exacerbate her asthma.   No flares in her breathing recently.    Past Medical History:  Diagnosis Date   Asthma    History of recurrent UTIs      Family History  Problem Relation Age of Onset   Diabetes Mother    Hyperlipidemia Mother    Hypertension Mother    Asthma Mother    Skin cancer Mother    Prostate cancer Father    Skin cancer Father    Heart attack Neg Hx    Sudden death Neg Hx      Social History   Socioeconomic History   Marital status: Married    Spouse name: Not on file   Number of children: Not on file   Years of education: Not on file   Highest education level: Not on file  Occupational History   Occupation: Librarian  Tobacco Use   Smoking status: Never   Smokeless tobacco: Never  Vaping Use   Vaping status: Never Used  Substance and Sexual Activity   Alcohol use: No    Alcohol/week: 0.0 standard drinks of alcohol   Drug use: No   Sexual activity: Not on file  Other Topics Concern   Not on file  Social History Narrative   Not on file   Social Drivers of Health   Financial Resource Strain: Low Risk  (11/14/2023)   Received from [facility]   Overall Financial Resource Strain (CARDIA)    Difficulty of Paying Living Expenses: Not very hard  Food Insecurity: No Food Insecurity (11/14/2023)   Received from [facility]   Hunger Vital Sign    Within the past 12 months, you worried that your food would run out before you got the money  to buy more.: Never true    Within the past 12 months, the food you bought just didn't last and you didn't have money to get more.: Never true  Transportation Needs: No Transportation Needs (11/14/2023)   Received from [facility] - Transportation    Lack of Transportation (Medical): No    Lack of Transportation (Non-Medical): No  Physical Activity: Sufficiently Active (11/14/2023)   Received from [facility]   Exercise Vital Sign    On average, how many days per week do you engage in moderate to strenuous exercise (like a brisk walk)?: 6 days    On average, how many minutes do you engage in exercise at this level?: 80 min  Stress: No Stress Concern Present (11/14/2023)   Received from [facility] - Occupational Stress Questionnaire    Feeling of Stress : Only a little  Social Connections: Socially Integrated (11/14/2023)   Received from [facility]   Social Network    How would you rate your social network (family, work, friends)?: Good participation with social networks  Intimate Partner Violence: Not At Risk (11/14/2023)   Received from [facility]  HITS    Over the last 12 months how often did your partner physically hurt you?: Never    Over the last 12 months how often did your partner insult you or talk down to you?: Never    Over the last 12 months how often did your partner threaten you with physical harm?: Never    Over the last 12 months how often did your partner scream or curse at you?: Never     Allergies  Allergen Reactions   Suprax [Cefixime] Hives     Outpatient Medications Prior to Visit  Medication Sig Dispense Refill   albuterol  (PROAIR  HFA) 108 (90 Base) MCG/ACT inhaler 2 puffs every 4 hours as needed only  if your can't catch your breath 1 each 1   benzonatate  (TESSALON ) 200 MG capsule Take 1 capsule (200 mg total) by mouth 3 (three) times daily as needed for cough. 30 capsule 1   DULERA  200-5  MCG/ACT AERO TAKE 2 PUFFS FIRST THING IN AM AND THEN ANOTHER 2 PUFFS ABOUT 12 HOURS LATER. 13 each 6   escitalopram  (LEXAPRO ) 20 MG tablet Take 20 mg by mouth daily.       famotidine  (PEPCID ) 20 MG tablet One after supper 30 tablet 11   pantoprazole  (PROTONIX ) 40 MG tablet Take 1 tablet (40 mg total) by mouth daily. Take 30-60 min before first meal of the day 30 tablet 2   montelukast  (SINGULAIR ) 10 MG tablet Take 1 tablet (10 mg total) by mouth at bedtime. 30 tablet 11   predniSONE  (DELTASONE ) 10 MG tablet Take 4 tabs by mouth for 3 days, then 3 for 3 days, 2 for 3 days, 1 for 3 days and stop 30 tablet 0   No facility-administered medications prior to visit.    Review of Systems  Constitutional:  Negative for chills, fever, malaise/fatigue and weight loss.  HENT:  Negative for congestion, sinus pain and sore throat.   Eyes: Negative.   Respiratory:  Positive for shortness of breath and wheezing. Negative for cough, hemoptysis and sputum production.   Cardiovascular:  Negative for chest pain, palpitations, orthopnea, claudication and leg swelling.  Gastrointestinal:  Negative for abdominal pain, heartburn, nausea and vomiting.  Genitourinary: Negative.   Musculoskeletal:  Negative for joint pain and myalgias.  Skin:  Negative for rash.  Neurological:  Negative for weakness.  Endo/Heme/Allergies:  Positive for environmental allergies.  Psychiatric/Behavioral: Negative.      Objective:   Vitals:   03/27/24 1424  BP: 123/75  Pulse: 67  SpO2: 96%  Weight: 159 lb (72.1 kg)  Height: 5' 5 (1.651 m)   Physical Exam Constitutional:      General: She is not in acute distress.    Appearance: She is not ill-appearing.  HENT:     Head: Normocephalic and atraumatic.     Right Ear: No swelling or tenderness. Tympanic membrane is not injected or erythematous.     Left Ear: No swelling or tenderness. Tympanic membrane is not injected or erythematous.  Eyes:     General: No scleral  icterus.    Conjunctiva/sclera: Conjunctivae normal.  Cardiovascular:     Rate and Rhythm: Normal rate and regular rhythm.     Pulses: Normal pulses.     Heart sounds: Normal heart sounds. No murmur heard. Pulmonary:     Effort: Pulmonary effort is normal.     Breath sounds: No wheezing, rhonchi or rales.  Musculoskeletal:     Right lower leg: No edema.  Left lower leg: No edema.  Neurological:     Mental Status: She is alert.     CBC    Component Value Date/Time   WBC 4.1 06/28/2011 0954   RBC 4.07 06/28/2011 0954   HGB 12.0 06/28/2011 0954   HCT 37.6 06/28/2011 0954   PLT 179 06/28/2011 0954   MCV 92.4 06/28/2011 0954   MCH 29.5 06/28/2011 0954   MCHC 31.9 06/28/2011 0954   RDW 14.6 06/28/2011 0954   LYMPHSABS 1.1 06/27/2011 2219   MONOABS 0.5 06/27/2011 2219   EOSABS 0.1 06/27/2011 2219   BASOSABS 0.0 06/27/2011 2219      Latest Ref Rng & Units 06/28/2011    9:54 AM 06/27/2011   10:19 PM  BMP  Glucose 70 - 99 mg/dL 72  891   BUN 6 - 23 mg/dL 12  22   Creatinine 9.49 - 1.10 mg/dL 9.33  9.09   Sodium 864 - 145 mEq/L 142  140   Potassium 3.5 - 5.1 mEq/L 3.1  3.8   Chloride 96 - 112 mEq/L 105  102   CO2 19 - 32 mEq/L 29  29   Calcium 8.4 - 10.5 mg/dL 8.9  9.3    Chest imaging:  PFT:     No data to display          Labs:  Path:  Echo:  Heart Catheterization:  Assessment & Plan:   Moderate persistent asthma with status asthmaticus  Discussion: Nolan Hedeen is a 63 year old woman, never smoker with history of asthma who returns to pulmonary clinic for follow up.   Moderate persistent asthma - continue dulera  2 puffs twice daily - use albuterol  inhaler 1-2 puffs every 4-6 hours as needed - handicap placard filled out today  Follow up with Dr. Darlean in 1 year  Dorn Chill, MD [facility] Office: [phone]   Current Outpatient Medications:    albuterol  (PROAIR  HFA) 108 (90 Base) MCG/ACT inhaler, 2 puffs  every 4 hours as needed only  if your can't catch your breath, Disp: 1 each, Rfl: 1   benzonatate  (TESSALON ) 200 MG capsule, Take 1 capsule (200 mg total) by mouth 3 (three) times daily as needed for cough., Disp: 30 capsule, Rfl: 1   DULERA  200-5 MCG/ACT AERO, TAKE 2 PUFFS FIRST THING IN AM AND THEN ANOTHER 2 PUFFS ABOUT 12 HOURS LATER., Disp: 13 each, Rfl: 6   escitalopram  (LEXAPRO ) 20 MG tablet, Take 20 mg by mouth daily.  , Disp: , Rfl:    famotidine  (PEPCID ) 20 MG tablet, One after supper, Disp: 30 tablet, Rfl: 11   pantoprazole  (PROTONIX ) 40 MG tablet, Take 1 tablet (40 mg total) by mouth daily. Take 30-60 min before first meal of the day, Disp: 30 tablet, Rfl: 2

## 2024-03-29 NOTE — Telephone Encounter (Signed)
 Spoke with patient and she states Dr. Kara completed her Disability Parking Placard form at her visit on 03/27/24
